# Patient Record
Sex: Female | Born: 2000
Health system: Southern US, Community
[De-identification: ages and names within clinical notes are randomized; demographics above are authoritative.]

## PROBLEM LIST (undated history)

## (undated) DIAGNOSIS — R519 Headache, unspecified: Secondary | ICD-10-CM

## (undated) DIAGNOSIS — R51 Headache: Secondary | ICD-10-CM

## (undated) DIAGNOSIS — R42 Dizziness and giddiness: Secondary | ICD-10-CM

## (undated) HISTORY — DX: Headache: R51

## (undated) HISTORY — DX: Dizziness and giddiness: R42

## (undated) HISTORY — DX: Headache, unspecified: R51.9

## (undated) HISTORY — PX: TOOTH EXTRACTION: SUR596

---
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
## 2018-03-23 ENCOUNTER — Other Ambulatory Visit: Payer: Self-pay | Admitting: "Endocrinology

## 2018-03-23 DIAGNOSIS — E221 Hyperprolactinemia: Secondary | ICD-10-CM | POA: Diagnosis not present

## 2018-03-24 LAB — PROLACTIN: Prolactin: 21.4 ng/mL (ref 4.8–23.3)

## 2018-03-27 ENCOUNTER — Ambulatory Visit (INDEPENDENT_AMBULATORY_CARE_PROVIDER_SITE_OTHER): Payer: 59 | Admitting: "Endocrinology

## 2018-03-27 ENCOUNTER — Encounter: Payer: Self-pay | Admitting: "Endocrinology

## 2018-03-27 VITALS — BP 103/71 | HR 75 | Ht 68.0 in | Wt 125.0 lb

## 2018-03-27 DIAGNOSIS — E221 Hyperprolactinemia: Secondary | ICD-10-CM | POA: Diagnosis not present

## 2018-03-27 MED ORDER — BROMOCRIPTINE MESYLATE 2.5 MG PO TABS: 1.2500 mg | ORAL_TABLET | ORAL | 3 refills | Status: DC

## 2018-03-27 MED ORDER — BROMOCRIPTINE MESYLATE 2.5 MG PO TABS: 1.2500 mg | ORAL_TABLET | ORAL | 6 refills | Status: DC

## 2018-03-27 MED FILL — BROMOCRIPTINE 2.5 MG TABLET: 2.5 | 84 days supply | Qty: 6 | Fill #0

## 2018-03-27 NOTE — Progress Notes (Signed)
Endocrinology follow-up note  Subjective:    Patient ID: Katherine Howe, female    DOB: 2000-08-24, PCP Lemmie Evens, MD   Past Medical History:  Diagnosis Date  . Dizziness   . Headache    Past Surgical History:  Procedure Laterality Date  . TOOTH EXTRACTION     5th-6th grade  . WISDOM TOOTH EXTRACTION  12/10/2015   Social History   Socioeconomic History  . Marital status: Single    Spouse name: Not on file  . Number of children: 0  . Years of education: 1  . Highest education level: Not on file  Occupational History  . 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 Financial resource strain: Not on file  . Food insecurity:    Worry: Not on file    Inability: Not on file  . Transportation needs:    Medical: Not on file    Non-medical: Not on file  Tobacco Use  . Smoking status: Never Smoker  . Smokeless tobacco: Never Used  Substance and Sexual Activity  . Alcohol use: No  . Drug use: No  . Sexual activity: Never  Lifestyle  . Physical activity:    Days per week: Not on file    Minutes per session: Not on file  . Stress: Not on file  Relationships  . Social connections:    Talks on phone: Not on file    Gets together: Not on file    Attends religious service: Not on file    Active member of club or organization: Not on file    Attends meetings of clubs or organizations: Not on file    Relationship status: Not on file  Other Topics Concern  . Not on file  Social History Narrative   Lives at home w/ her family   Right-handed   Caffeine: drinks 1 diet drink per day   Outpatient Encounter Medications as of 03/27/2018  Medication Sig  . bromocriptine (PARLODEL) 2.5 MG tablet Take 0.5 tablets (1.25 mg total) by mouth 2 (two) times a week.  Marland Kitchen ibuprofen (ADVIL,MOTRIN) 200 MG tablet Take 400 mg by mouth every 6 (six) hours as needed.  . [DISCONTINUED] bromocriptine (PARLODEL) 2.5 MG tablet Take 0.5 tablets (1.25 mg total) by mouth once a week.  .  [DISCONTINUED] bromocriptine (PARLODEL) 2.5 MG tablet Take 0.5 tablets (1.25 mg total) by mouth once a week.   No facility-administered encounter medications on file as of 03/27/2018.    ALLERGIES: Allergies  Allergen Reactions  . Zithromax [Azithromycin] Hives    VACCINATION STATUS:  There is no immunization history on file for this patient.  HPI   17 year old female patient with medical history as above. She is being seen in follow-up for hyperprolactinemia associated with pituitary hyperplasia confirmed by MRI on 3 separate occasions, since 2016-12-09.  -She is currently on bromocriptine 1.25 mg once a week for hyperprolactinemia.  Her previsit prolactin level is higher at 21.4 increasing from 10.7 during her last visit.  Her prolactin maximum of 30.2 prior to initiation of treatment with bromocriptine.    She last underwent MRI of the brain in November 2018 due to headaches, dizziness. This study was reported to show similar findings with her prior studies including 2 MRIs done in 12-09-16.   The second MRI in 2016-12-09 confirmed pituitary height of 11.3 with no discrete adenoma however possible mass effect upon overlying optic chiasm, causing mild flattening of the optic nerves bilaterally.  No micro or  macroadenoma was reported.  - She reports clinical improvement with no further visual complaints nor headaches. - She denies galactorrhea, and now her menstrual cycles are normal. - She is in college majoring in business , not planning any pregnancy in the near future.    Review of Systems  Constitutional: + She has lost 3 pounds since last visit,   no fatigue, no subjective hyperthermia, no subjective hypothermia Eyes: no blurry vision, no xerophthalmia ENT: no sore throat, no nodules palpated in throat, no dysphagia/odynophagia, no hoarseness Cardiovascular: no Chest Pain, no Shortness of Breath, no palpitations, no leg swelling Respiratory: no cough, no SOB Gastrointestinal:  no Nausea/Vomiting/Diarhhea Musculoskeletal: no muscle/joint aches Skin: no rashes Neurological: no tremors, no numbness, no tingling, + dizziness/+ headaches  Psychiatric: no depression, no anxiety  Objective:    BP 103/71   Pulse 75   Ht 5\' 8"  (1.727 m)   Wt 125 lb (56.7 kg)   BMI 19.01 kg/m   Wt Readings from Last 3 Encounters:  03/27/18 125 lb (56.7 kg) (48 %, Z= -0.06)*  10/31/17 128 lb (58.1 kg) (55 %, Z= 0.14)*  06/02/17 130 lb (59 kg) (61 %, Z= 0.27)*   * Growth percentiles are based on CDC (Girls, 2-20 Years) data.    Physical Exam  Constitutional: Appropriate weight for height, not in acute distress.  She has normal state of mind.  Eyes: PERRLA, EOMI, no exophthalmos ENT: moist mucous membranes, no thyromegaly, no cervical lymphadenopathyt Musculoskeletal: No gross deformities,  strength intact in all four extremities Skin: moist, warm, no rashes Neurological: no tremor with outstretched hands    Recent Results (from the past 2160 hour(s))  Prolactin     Status: None   Collection Time: 03/23/18  2:21 PM  Result Value Ref Range   Prolactin 21.4 4.8 - 23.3 ng/mL    Assessment & Plan:   1. Hyperprolactinemia ([place])  -The official report of the MRI is that of pituitary hyperplasia  ( with no distinct adenoma) to significant height of 11.3 mm ( normal at 9 mm) with local mass effect on the optic chiasm causing clinically significant blurry vision/headaches.  -  She has felt better since her last visit. She is tolerating  bromocriptine.   - She will continue to benefit from low-dose bromocriptine with a goal of  keeping prolactin in normal range for at least 2 years provided that her surveillance MRI does not show any nodular lesions.   -Due to increasing prolactin level to 21.4 from 10.7 during her last visit, I advised her to increase her bromocriptine to 1.25 mg twice weekly .  -Bromocriptine has been used safely in younger patients older than 19 years  old.  -If she remains clinically stable, she would not require pituitary/sella MRI for now.  - However, if she starts to have sudden visual change or worsening headache, she may need repeat MRI sooner.  - I plan to see her in 6 months with repeat of her prolactin levels.  Her other endocrine functions are  within normal limits.   -Please fax a copy of this note to Dr. Sarina Ill , neurology [facility] and Dr. Sharol Roussel, Optometry.  - I advised patient to maintain close follow up with Lemmie Evens, MD for primary care needs. Follow up plan: Return in about 6 months (around 09/27/2018) for Follow up with Pre-visit Labs.  Glade Lloyd, MD Phone: [phone]  Fax: [phone]  -  This note was partially dictated with voice recognition software. Similar  sounding words can be transcribed inadequately or may not  be corrected upon review.  03/27/2018, 4:37 PM

## 2018-03-30 ENCOUNTER — Other Ambulatory Visit: Payer: Self-pay | Admitting: "Endocrinology

## 2018-03-30 ENCOUNTER — Telehealth: Payer: Self-pay | Admitting: "Endocrinology

## 2018-03-30 MED ORDER — BROMOCRIPTINE MESYLATE 2.5 MG PO TABS: 1.2500 mg | ORAL_TABLET | ORAL | 3 refills | Status: DC

## 2018-03-30 NOTE — Telephone Encounter (Signed)
She needs 4 each month, I ordered 12 for 3 months with 3 refills.

## 2018-03-30 NOTE — Telephone Encounter (Signed)
Hope called on behalf of Kimbly stating that the pharmacy only gave her #6 pills and now she is taking  2 again and mother is stating she will need more, please advise?

## 2018-04-18 MED FILL — BROMOCRIPTINE 2.5 MG TABLET: 2.5 | 84 days supply | Qty: 12 | Fill #0

## 2018-05-13 DIAGNOSIS — J029 Acute pharyngitis, unspecified: Secondary | ICD-10-CM | POA: Diagnosis not present

## 2018-07-08 DIAGNOSIS — J069 Acute upper respiratory infection, unspecified: Secondary | ICD-10-CM | POA: Diagnosis not present

## 2018-08-05 IMAGING — MR MR HEAD WO/W CM
6 of 10 series · 19 of 48 positions shown · IV contrast (Yes)
Comparison: MRI head 11/09/2016

CLINICAL DATA: Enlarged pituitary gland. Headache and vision
change.

EXAM:
MRI HEAD WITHOUT AND WITH CONTRAST
TECHNIQUE: Multiplanar, multiecho pulse sequences of the brain and surrounding
structures were obtained without and with intravenous contrast.
CONTRAST:  6mL MULTIHANCE GADOBENATE DIMEGLUMINE 529 MG/ML IV SOLN

[Series 3: T2 · axial · 5.0mm · 0.43mm/px · z∈[-60,+91]mm · 4 of 24 slices shown]
[im 1/24]
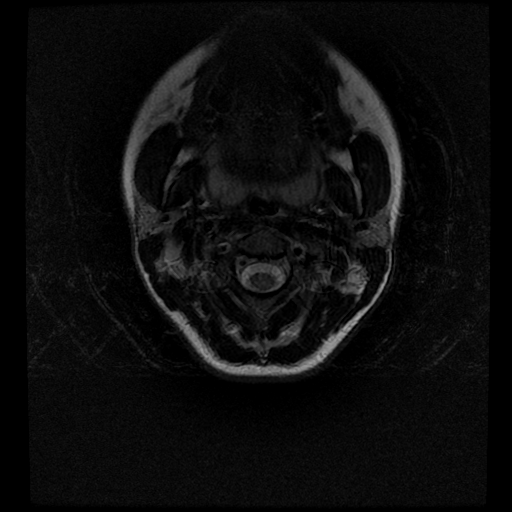
[im 8/24]
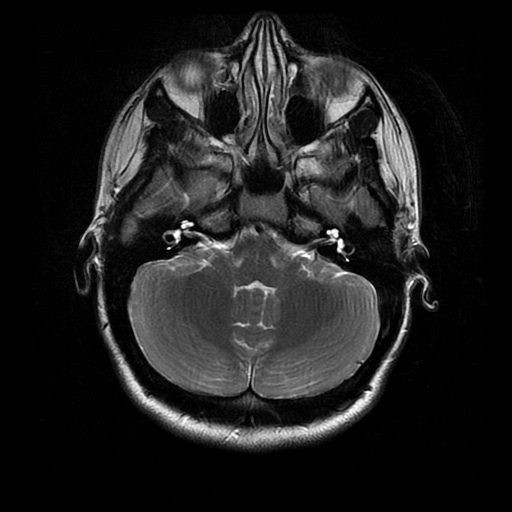
[im 16/24]
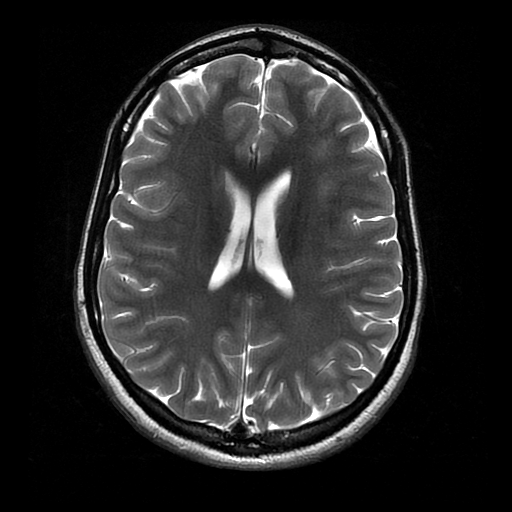
[im 24/24]
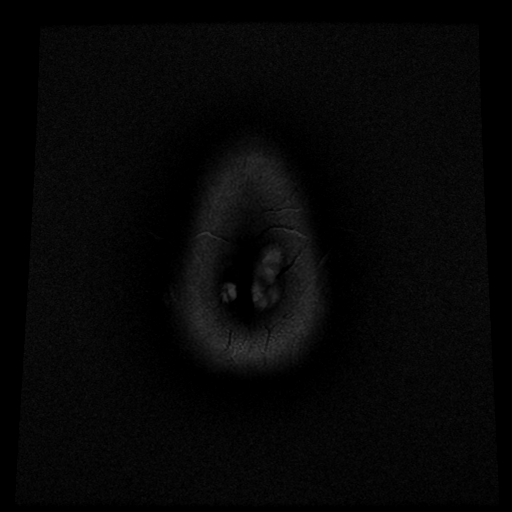

[Series 5: FLAIR · axial · 3.0mm · 0.43mm/px · z∈[-60,+77]mm · 5 of 26 slices shown]
[im 1/26]
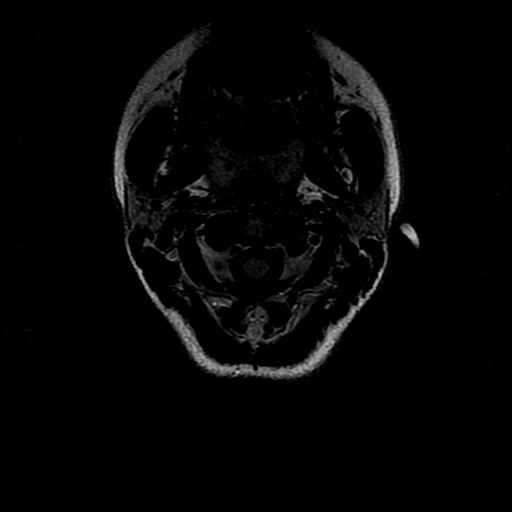
[im 7/26]
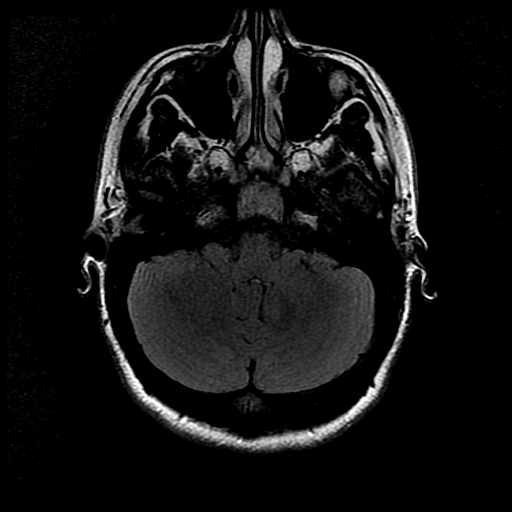
[im 13/26]
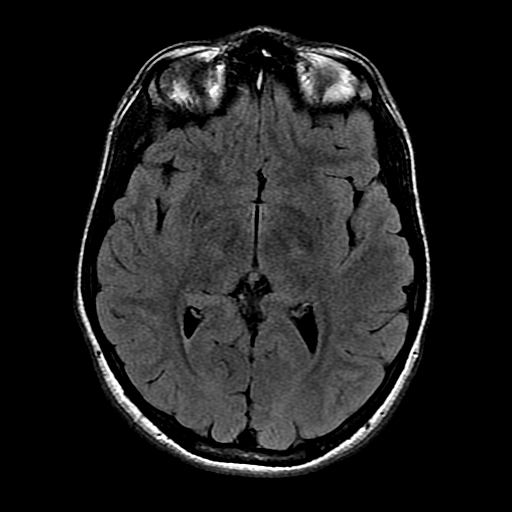
[im 19/26]
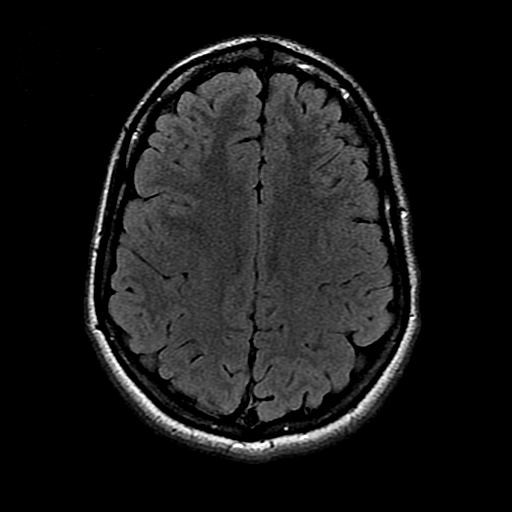
[im 26/26]
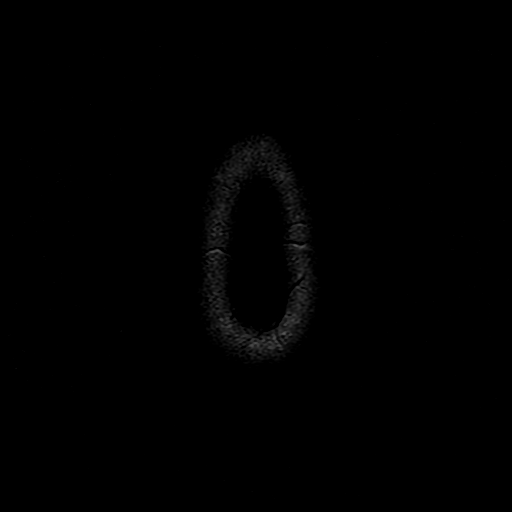

[Series 6: T1 · sagittal · 3.0mm · 0.35mm/px · 2 of 12 slices shown]
[im 1/12]
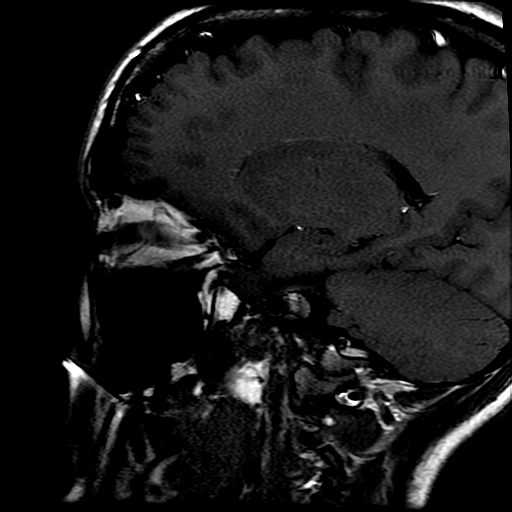
[im 12/12]
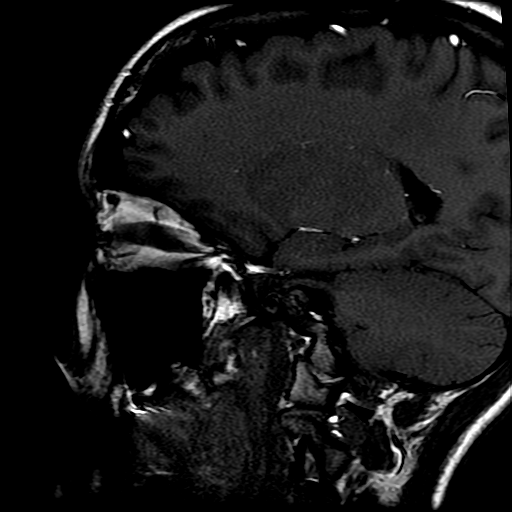

[Series 10: T1 post-contrast · sagittal · 3.0mm · 0.35mm/px · 2 of 12 slices shown (1 of 3)]
[im 1/12]
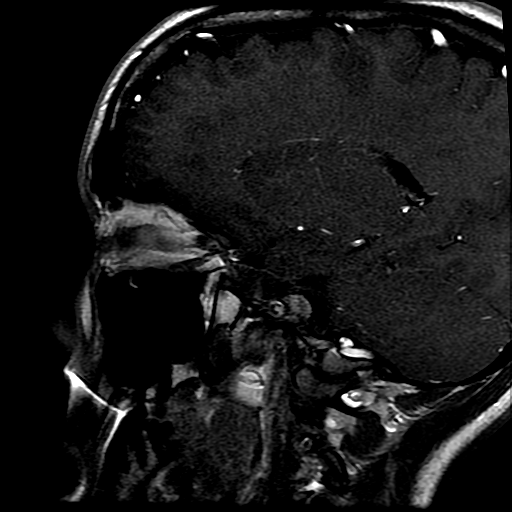
[im 12/12]
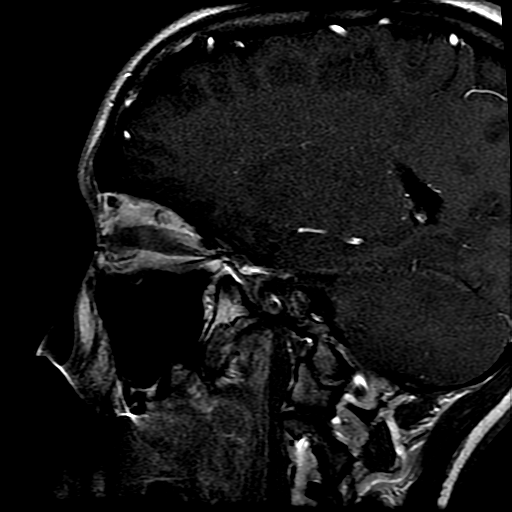

[Series 11: T1 post-contrast · coronal · 3.0mm · 0.35mm/px · 2 of 12 slices shown (2 of 3)]
[im 1/12]
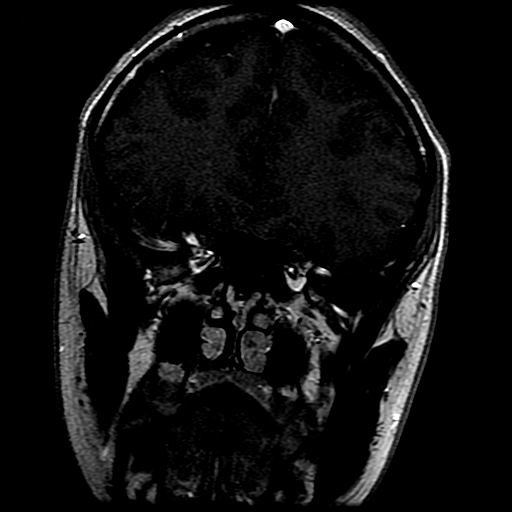
[im 12/12]
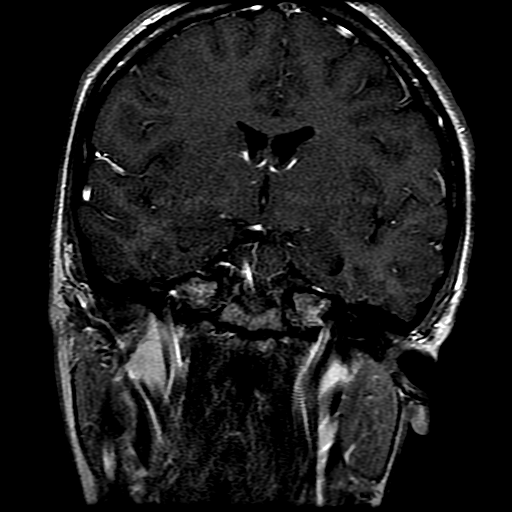

[Series 12: T1 post-contrast · coronal · 5.0mm · 0.45mm/px · 4 of 24 slices shown (3 of 3)]
[im 1/24]
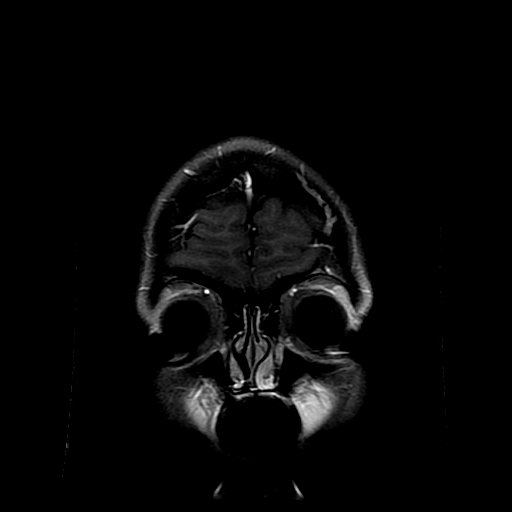
[im 8/24]
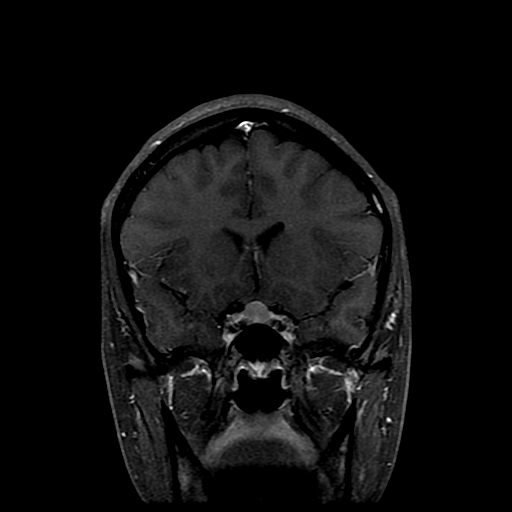
[im 16/24]
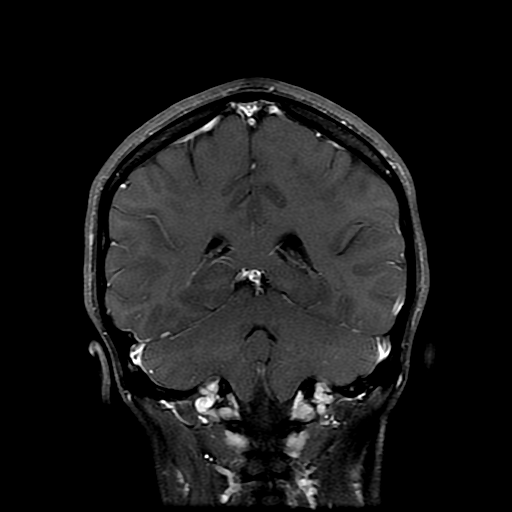
[im 24/24]
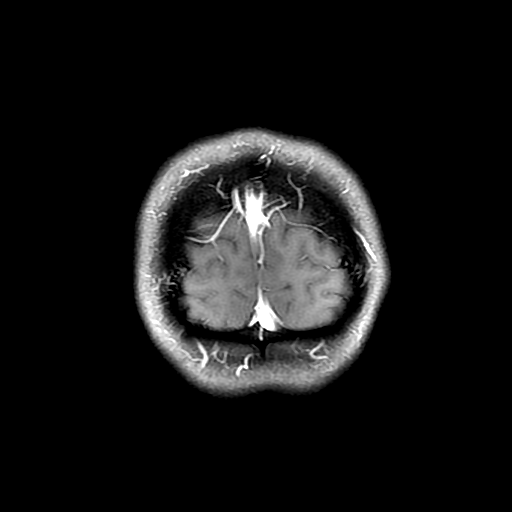

[19 of 48 positions shown; findings below may reference images not displayed]

FINDINGS: Brain: Dynamic pituitary protocol was performed. Pituitary is mildly
enlarged measuring 11.3 mm in height. Convex superior border of the
pituitary gland touching the optic chiasm causing mild flattening of
the optic nerves bilaterally. The pituitary enhances homogeneously.
No evidence of microadenoma. Cavernous sinus normal. Infundibulum is
compressed but midline.

Vascular: Normal arterial flow voids.  Normal venous enhancement.

Skull and upper cervical spine: Negative

Sinuses/Orbits: Negative

Other: None
IMPRESSION: Enlarged pituitary measuring 11.3 mm in height. The pituitary
enhances homogeneously. Findings most compatible with pituitary
hyperplasia. Negative for microadenoma. Mild flattening of the optic
nerves bilaterally

## 2018-08-09 MED FILL — BROMOCRIPTINE 2.5 MG TABLET: 2.5 | 84 days supply | Qty: 12 | Fill #1

## 2018-08-14 ENCOUNTER — Encounter: Payer: Self-pay | Admitting: Orthopedic Surgery

## 2018-08-14 ENCOUNTER — Ambulatory Visit (INDEPENDENT_AMBULATORY_CARE_PROVIDER_SITE_OTHER): Payer: 59

## 2018-08-14 ENCOUNTER — Ambulatory Visit (INDEPENDENT_AMBULATORY_CARE_PROVIDER_SITE_OTHER): Payer: 59 | Admitting: Orthopedic Surgery

## 2018-08-14 VITALS — BP 102/67 | HR 98 | Ht 68.0 in | Wt 123.0 lb

## 2018-08-14 DIAGNOSIS — M19072 Primary osteoarthritis, left ankle and foot: Secondary | ICD-10-CM

## 2018-08-14 DIAGNOSIS — M25572 Pain in left ankle and joints of left foot: Secondary | ICD-10-CM | POA: Diagnosis not present

## 2018-08-14 NOTE — Progress Notes (Addendum)
Progress Note   Patient ID: Katherine Howe, female   DOB: [birth year], 17 y.o.   MRN: [id number]   Chief Complaint  Patient presents with  . Foot Pain    Left no known injuries    HPI The patient presents for evaluation of left foot   Location left foot Duration weeks 3-4 Quality dull aching pain Severity primarily mild occasionally severe and intermittent Associated with standing walking toeing off  No trauma  Review of Systems  Neurological: Negative for tingling.  All other systems reviewed and are negative.  Current Meds  Medication Sig  . bromocriptine (PARLODEL) 2.5 MG tablet Take 0.5 tablets (1.25 mg total) by mouth 2 (two) times a week.    Past Medical History:  Diagnosis Date  . Dizziness   . Headache      Allergies  Allergen Reactions  . Zithromax [Azithromycin] Hives     BP 102/67   Pulse 98   Ht 5\' 8"  (1.727 m)   Wt 123 lb (55.8 kg)   BMI 18.70 kg/m    Physical Exam General appearance normal Oriented x3 normal Mood pleasant affect normal Gait normal  Ortho Exam Right foot  Inspection and palpation revealed no abnormalities Range of motion is full No instability was detected on stress testing Muscle tone and strength was normal without tremor Skin was warm dry and intact Good pulse and temperature were noted in the extremity Sensation revealed no abnormalities to light touch  Left foot ankle Tenderness at the anterior talofibular ligament into the sinus tarsi and into the dorsum of the foot no swelling Full range of motion at the ankle Trace positive anterior drawer with firm endpoint mild sulcus sign no inversion pain or instability no pain with anterior drawer maneuver Muscle tone and strength normal no tremor no atrophy Skin warm dry and intact no erythema Pulse and temperature of the foot normal No sensory changes   MEDICAL DECISION MAKING   Imaging:  X-ray foot and ankle left see dictated report no fracture  dislocation or bony abnormality no degenerative changes no swelling no soft tissue swelling  Encounter Diagnoses  Name Primary?  . Pain of joint of left ankle and foot Yes  . Ankle inflammation, left      PLAN: (RX., injection, surgery,frx,mri/ct, XR 2 body ares) Recommend ankle sleeve and anti-inflammatories should resolve over 4 to 6 weeks  No orders of the defined types were placed in this encounter.  9:57 PM 08/14/2018

## 2018-08-14 NOTE — Patient Instructions (Signed)
Get ankle sleeve for your ankle, at one of the local pharmacies. Take Aleve or Ibuprofen daily, for the next 2 weeks. You can also alternate heat and ice for the area.

## 2018-09-29 DIAGNOSIS — Z111 Encounter for screening for respiratory tuberculosis: Secondary | ICD-10-CM | POA: Diagnosis not present

## 2018-11-16 MED FILL — BROMOCRIPTINE 2.5 MG TABLET: 2.5 | 84 days supply | Qty: 12 | Fill #0

## 2018-12-13 ENCOUNTER — Telehealth: Payer: Self-pay

## 2018-12-13 DIAGNOSIS — E221 Hyperprolactinemia: Secondary | ICD-10-CM

## 2018-12-13 NOTE — Telephone Encounter (Signed)
LeighAnn Khadeeja Elden, CMA  

## 2018-12-14 DIAGNOSIS — E221 Hyperprolactinemia: Secondary | ICD-10-CM | POA: Diagnosis not present

## 2018-12-15 LAB — PROLACTIN: Prolactin: 28.7 ng/mL — ABNORMAL HIGH (ref 4.8–23.3)

## 2018-12-18 ENCOUNTER — Other Ambulatory Visit: Payer: Self-pay

## 2018-12-18 ENCOUNTER — Ambulatory Visit (INDEPENDENT_AMBULATORY_CARE_PROVIDER_SITE_OTHER): Payer: 59 | Admitting: "Endocrinology

## 2018-12-18 ENCOUNTER — Encounter: Payer: Self-pay | Admitting: "Endocrinology

## 2018-12-18 DIAGNOSIS — E221 Hyperprolactinemia: Secondary | ICD-10-CM | POA: Diagnosis not present

## 2018-12-18 NOTE — Progress Notes (Signed)
Endocrinology Telehealth Visit Follow up Note -During COVID -19 Pandemic  I connected with@ on 12/18/2018   by telephone and verified that I am speaking with the correct person using two identifiers. Katherine Howe, July 18, 2001. she has verbally consented to this visit. All issues noted in this document were discussed and addressed. The format was not optimal for physical exam.   Subjective:    Patient ID: Katherine Howe, female    DOB: 08-07-2001, PCP Lemmie Evens, MD   Past Medical History:  Diagnosis Date  . Dizziness   . Headache    Past Surgical History:  Procedure Laterality Date  . TOOTH EXTRACTION     5th-6th grade  . WISDOM TOOTH EXTRACTION  2017   Social History   Socioeconomic History  . Marital status: Single    Spouse name: Not on file  . Number of children: 0  . Years of education: 46  . Highest education level: Not on file  Occupational History  . 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 Financial resource strain: Not on file  . Food insecurity:    Worry: Not on file    Inability: Not on file  . Transportation needs:    Medical: Not on file    Non-medical: Not on file  Tobacco Use  . Smoking status: Never Smoker  . Smokeless tobacco: Never Used  Substance and Sexual Activity  . Alcohol use: No  . Drug use: No  . Sexual activity: Never  Lifestyle  . Physical activity:    Days per week: Not on file    Minutes per session: Not on file  . Stress: Not on file  Relationships  . Social connections:    Talks on phone: Not on file    Gets together: Not on file    Attends religious service: Not on file    Active member of club or organization: Not on file    Attends meetings of clubs or organizations: Not on file    Relationship status: Not on file  Other Topics Concern  . Not on file  Social History Narrative   Lives at home w/ her family   Right-handed   Caffeine: drinks 1 diet drink per day    Outpatient Encounter Medications as of 12/18/2018  Medication Sig  . bromocriptine (PARLODEL) 2.5 MG tablet Take 1.25 mg by mouth as directed. 3 times a week  . ibuprofen (ADVIL,MOTRIN) 200 MG tablet Take 400 mg by mouth every 6 (six) hours as needed.  . [DISCONTINUED] bromocriptine (PARLODEL) 2.5 MG tablet Take 0.5 tablets (1.25 mg total) by mouth 2 (two) times a week.   No facility-administered encounter medications on file as of 12/18/2018.    ALLERGIES: Allergies  Allergen Reactions  . Zithromax [Azithromycin] Hives    VACCINATION STATUS:  There is no immunization history on file for this patient.  HPI   18 year old female patient with medical history as above. She is being engaged in telehealth in follow-up for hyperprolactinemia associated with pituitary hyperplasia confirmed by MRI on 3 separate occasions, since March 2018.  -She is currently on bromocriptine 1.25 mg twice a week for hyperprolactinemia.  Her previsit prolactin level is higher at 28.7 increasing from 10.7 during her last visit.  Her prolactin maximum of 30.2 prior to initiation of treatment with  bromocriptine.    She last underwent MRI of the brain in November 2018 due to headaches, dizziness. This study was reported to show similar findings with her prior studies including 2 MRIs done in Nov 24, 2016.   The second MRI in 11/24/2016 confirmed pituitary height of 11.3 with no discrete adenoma however possible mass effect upon overlying optic chiasm, causing mild flattening of the optic nerves bilaterally.  No micro or macroadenoma was reported.  - She reports clinical improvement with no further visual complaints nor headaches. - She denies galactorrhea, and now her menstrual cycles are normal. - She is in college majoring in business , not planning any pregnancy in the near future.    Review of Systems Limited: As above.   Objective:    There were no vitals taken for this visit.  Wt Readings from Last 3  Encounters:  08/14/18 123 lb (55.8 kg) (42 %, Z= -0.21)*  03/27/18 125 lb (56.7 kg) (48 %, Z= -0.06)*  10/31/17 128 lb (58.1 kg) (55 %, Z= 0.14)*   * Growth percentiles are based on CDC (Girls, 2-20 Years) data.    Physical Exam   Recent Results (from the past 11/25/58 hour(s))  Prolactin     Status: Abnormal   Collection Time: 12/14/18  9:44 AM  Result Value Ref Range   Prolactin 28.7 (H) 4.8 - 23.3 ng/mL    Assessment & Plan:   1. Hyperprolactinemia ([place])  -The official report of the MRI is that of pituitary hyperplasia  ( with no distinct adenoma) to significant height of 11.3 mm ( normal at 9 mm) with local mass effect on the optic chiasm causing clinically significant blurry vision/headaches.  -  She has felt better since her last visit. She is tolerating  bromocriptine.   - She will continue to benefit from low-dose bromocriptine with a goal of  keeping prolactin in normal range for at least 2 years provided that her surveillance MRI does not show any nodular lesions.   -Due to increasing prolactin level to 28.7 from 10.7 during her last visit, I advised her to increase her bromocriptine to 1.25 mg 3 times weekly .  -Bromocriptine has been used safely in younger patients older than 20 years old.  Her clinical response is not satisfactory, she will be considered for cabergoline therapy after her next visit.  -If she remains clinically stable, she would not require pituitary/sella MRI for now.  - However, if she starts to have sudden visual change or worsening headache, she may need repeat MRI sooner.  - I plan to see her in 6 months with repeat of her prolactin levels.  Her other endocrine functions are  within normal limits.   -Please fax a copy of this note to Dr. Sarina Ill , neurology [facility] and Dr. Sharol Roussel, Optometry.  - I advised patient to maintain close follow up with Lemmie Evens, MD for primary care needs. Follow up plan: Return in about 3 months  (around 03/19/2019) for Follow up with Pre-visit Labs.  Glade Lloyd, MD Phone: [phone]  Fax: [phone]  -  This note was partially dictated with voice recognition software. Similar sounding words can be transcribed inadequately or may not  be corrected upon review.  12/18/2018, 5:27 PM

## 2019-01-30 DIAGNOSIS — B081 Molluscum contagiosum: Secondary | ICD-10-CM | POA: Diagnosis not present

## 2019-01-30 DIAGNOSIS — L739 Follicular disorder, unspecified: Secondary | ICD-10-CM | POA: Diagnosis not present

## 2019-02-19 ENCOUNTER — Other Ambulatory Visit: Payer: Self-pay | Admitting: "Endocrinology

## 2019-02-19 ENCOUNTER — Telehealth: Payer: Self-pay

## 2019-02-19 MED ORDER — CABERGOLINE 0.5 MG PO TABS: 0.2500 mg | ORAL_TABLET | ORAL | 1 refills | Status: DC

## 2019-02-19 MED FILL — CABERGOLINE 0.5 MG TABS: 0.5 | 28 days supply | Qty: 8 | Fill #0

## 2019-02-19 NOTE — Telephone Encounter (Signed)
Patients mother is aware 

## 2019-02-19 NOTE — Telephone Encounter (Signed)
[place] mom Is calling stating that Shirlee Limerick has finished her medication that was prescribed and that you were going to call her in a New Medication once the other had been taken, Please call in new Medication to [facility], Thanks

## 2019-02-19 NOTE — Telephone Encounter (Signed)
Can you verify patient name. Is it [facility] or Daissy Surret?

## 2019-02-19 NOTE — Telephone Encounter (Signed)
Mothers name is Hope patients name is Katherine Howe

## 2019-02-19 NOTE — Telephone Encounter (Signed)
I sent a prescription to [facility] from

## 2019-03-21 ENCOUNTER — Ambulatory Visit: Payer: 59 | Admitting: "Endocrinology

## 2019-03-30 ENCOUNTER — Other Ambulatory Visit: Payer: Self-pay | Admitting: "Endocrinology

## 2019-03-30 DIAGNOSIS — E221 Hyperprolactinemia: Secondary | ICD-10-CM | POA: Diagnosis not present

## 2019-03-31 LAB — PROLACTIN: Prolactin: 4.7 ng/mL — ABNORMAL LOW (ref 4.8–23.3)

## 2019-04-09 ENCOUNTER — Encounter: Payer: Self-pay | Admitting: "Endocrinology

## 2019-04-09 ENCOUNTER — Other Ambulatory Visit: Payer: Self-pay

## 2019-04-09 ENCOUNTER — Ambulatory Visit (INDEPENDENT_AMBULATORY_CARE_PROVIDER_SITE_OTHER): Payer: 59 | Admitting: "Endocrinology

## 2019-04-09 DIAGNOSIS — E221 Hyperprolactinemia: Secondary | ICD-10-CM | POA: Diagnosis not present

## 2019-04-09 MED ORDER — CABERGOLINE 0.5 MG PO TABS: 0.2500 mg | ORAL_TABLET | ORAL | 2 refills | Status: DC

## 2019-04-09 NOTE — Progress Notes (Signed)
04/09/2019                                 Endocrinology Telehealth Visit Follow up Note -During COVID -19 Pandemic  I connected with Katherine Howe on 04/09/2019   by telephone and verified that I am speaking with the correct person using two identifiers. Katherine Scot Herald, Apr 23, 2001. she has verbally consented to this visit. All issues noted in this document were discussed and addressed. The format was not optimal for physical exam.   Subjective:    Patient ID: Katherine Howe, female    DOB: 2001/02/24, PCP Lemmie Evens, MD   Past Medical History:  Diagnosis Date  . Dizziness   . Headache    Past Surgical History:  Procedure Laterality Date  . TOOTH EXTRACTION     5th-6th grade  . WISDOM TOOTH EXTRACTION  2017   Social History   Socioeconomic History  . Marital status: Single    Spouse name: Not on file  . Number of children: 0  . Years of education: 78  . Highest education level: Not on file  Occupational History  . 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 Financial resource strain: Not on file  . Food insecurity    Worry: Not on file    Inability: Not on file  . Transportation needs    Medical: Not on file    Non-medical: Not on file  Tobacco Use  . Smoking status: Never Smoker  . Smokeless tobacco: Never Used  Substance and Sexual Activity  . Alcohol use: No  . Drug use: No  . Sexual activity: Never  Lifestyle  . Physical activity    Days per week: Not on file    Minutes per session: Not on file  . Stress: Not on file  Relationships  . Social Herbalist on phone: Not on file    Gets together: Not on file    Attends religious service: Not on file    Active member of club or organization: Not on file    Attends meetings of clubs or organizations: Not on file    Relationship status: Not on file  Other Topics Concern  . Not on file  Social History Narrative   Lives at home w/ her family   Right-handed   Caffeine: drinks  1 diet drink per day   Outpatient Encounter Medications as of 04/09/2019  Medication Sig  . cabergoline (DOSTINEX) 0.5 MG tablet Take 0.5 tablets (0.25 mg total) by mouth 2 (two) times a week.  Marland Kitchen ibuprofen (ADVIL,MOTRIN) 200 MG tablet Take 400 mg by mouth every 6 (six) hours as needed.  . [DISCONTINUED] cabergoline (DOSTINEX) 0.5 MG tablet Take 0.5 tablets (0.25 mg total) by mouth 2 (two) times a week.   No facility-administered encounter medications on file as of 04/09/2019.    ALLERGIES: Allergies  Allergen Reactions  . Zithromax [Azithromycin] Hives    VACCINATION STATUS:  There is no immunization history on file for this patient.  HPI   18 year old female patient with medical history as above. She is being engaged in telehealth in follow-up for hyperprolactinemia associated with pituitary hyperplasia confirmed by MRI on 3 separate occasions, since March 2018.  -She is currently on cabergoline 0.25 mg p.o. twice a week for hyperprolactinemia.      Her previsit prolactin level is controlled at 4.7, improving from 28.7 during her last visit.  Her prolactin maximum of 30.2 prior to initiation of treatment with bromocriptine.    She last underwent MRI of the brain in November 2018 due to headaches, dizziness. This study was reported to show similar findings with her prior studies including 2 MRIs done in December 10, 2016.   The second MRI in 2016/12/10 confirmed pituitary height of 11.3 with no discrete adenoma however possible mass effect upon overlying optic chiasm, causing mild flattening of the optic nerves bilaterally.  No micro or macroadenoma was reported.  - She reports clinical improvement with no further visual complaints nor headaches. - She denies galactorrhea, and now her menstrual cycles are normal. - She is in college majoring in business , not planning any pregnancy in the near future.    Review of Systems Limited: As above.   Objective:    There were no vitals taken  for this visit.  Wt Readings from Last 3 Encounters:  08/14/18 123 lb (55.8 kg) (42 %, Z= -0.21)*  03/27/18 125 lb (56.7 kg) (48 %, Z= -0.06)*  10/31/17 128 lb (58.1 kg) (55 %, Z= 0.14)*   * Growth percentiles are based on CDC (Girls, 2-20 Years) data.    Physical Exam   Recent Results (from the past 2158-12-11 hour(s))  Prolactin     Status: Abnormal   Collection Time: 03/30/19  8:01 AM  Result Value Ref Range   Prolactin 4.7 (L) 4.8 - 23.3 ng/mL    Assessment & Plan:   1. Hyperprolactinemia ([place])  -The official report of the MRI is that of pituitary hyperplasia  ( with no distinct adenoma) to significant height of 11.3 mm ( normal at 9 mm) with local mass effect on the optic chiasm causing clinically significant blurry vision/headaches.  -  She has felt better since her last visit. She switched to cabergoline since her last visit currently at 0.25 mg p.o. twice a week.  She has responded with lowering of her prolactin to 4.7.   - She will continue to benefit from low-dose cabergoline with a goal of  keeping prolactin in normal range for at least 2 years provided that her surveillance MRI does not show any nodular lesions.     -If she remains clinically stable, she would not require pituitary/sella MRI for now.  - However, if she starts to have sudden visual change or worsening headache, she may need repeat MRI sooner.  - I plan to see her in 6 months with repeat of her prolactin levels.  Her other endocrine functions are  within normal limits.   -Please fax a copy of this note to Dr. Sarina Ill , neurology [facility] and Dr. Sharol Roussel, Optometry.  - I advised patient to maintain close follow up with Lemmie Evens, MD for primary care needs.   Time for this visit: 15 minutes. Kendyl Festa Leedom  participated in the discussions, expressed understanding, and voiced agreement with the above plans.  All questions were answered to her satisfaction. she is encouraged to contact  clinic should she have any questions or concerns prior to her return visit.  Follow up plan: Return in about 6 months (around 10/10/2019) for Follow up with Pre-visit Labs.  Glade Lloyd, MD Phone: [phone]  Fax: [phone]  -  This note was partially dictated with voice recognition software. Similar sounding words can be transcribed inadequately or may not  be corrected upon review.  04/09/2019, 5:57 PM

## 2019-04-10 ENCOUNTER — Ambulatory Visit: Payer: 59 | Admitting: "Endocrinology

## 2019-04-10 ENCOUNTER — Other Ambulatory Visit: Payer: Self-pay

## 2019-04-10 MED ORDER — CABERGOLINE 0.5 MG PO TABS: 0.2500 mg | ORAL_TABLET | ORAL | 2 refills | Status: DC

## 2019-04-10 MED FILL — CABERGOLINE 0.5 MG TABS: 0.5 | 56 days supply | Qty: 8 | Fill #0

## 2019-05-09 DIAGNOSIS — H6122 Impacted cerumen, left ear: Secondary | ICD-10-CM | POA: Diagnosis not present

## 2019-05-09 DIAGNOSIS — R0982 Postnasal drip: Secondary | ICD-10-CM | POA: Diagnosis not present

## 2019-06-25 ENCOUNTER — Telehealth (INDEPENDENT_AMBULATORY_CARE_PROVIDER_SITE_OTHER): Payer: 59 | Admitting: Women's Health

## 2019-06-25 ENCOUNTER — Encounter: Payer: Self-pay | Admitting: Women's Health

## 2019-06-25 ENCOUNTER — Other Ambulatory Visit: Payer: Self-pay

## 2019-06-25 VITALS — Ht 68.0 in

## 2019-06-25 DIAGNOSIS — N3001 Acute cystitis with hematuria: Secondary | ICD-10-CM

## 2019-06-25 MED ORDER — NITROFURANTOIN MONOHYD MACRO 100 MG PO CAPS: 100.0000 mg | ORAL_CAPSULE | Freq: Two times a day (BID) | ORAL | 0 refills | Status: DC

## 2019-06-25 NOTE — Progress Notes (Signed)
   [facility] VIRTUAL GYN VISIT ENCOUNTER NOTE Patient name: Katherine Howe MRN [id number]  Date of birth: November 20, 2000  I connected with patient on 06/25/19 at  3:10 PM EST by MyChart video  and verified that I am speaking with the correct person using two identifiers.  Due to COVID-19 recommendations, pt is not currently in the office.    I discussed the limitations, risks, security and privacy concerns of performing an evaluation and management service by telephone and the availability of in person appointments. I also discussed with the patient that there may be a patient responsible charge related to this service. The patient expressed understanding and agreed to proceed.   Chief Complaint:   burning with urination (light pink when wiping)  History of Present Illness:   Katherine Howe is a 18 y.o. G62 Caucasian female being evaluated today for uti sx that began this am. Urinary frequency, burning w/ urination, light pink w/ wiping x 1. Away at college so can't give urine specimen/culture.     Patient's last menstrual period was 06/14/2019.  Last pap <21yo. Results were:  n/a Review of Systems:   Pertinent items are noted in HPI Denies fever/chills, dizziness, headaches, visual disturbances, fatigue, shortness of breath, chest pain, abdominal pain, vomiting, abnormal vaginal discharge/itching/odor/irritation, problems with periods, bowel movements, urination, or intercourse unless otherwise stated above.  Pertinent History Reviewed:  Reviewed past medical,surgical, social, obstetrical and family history.  Reviewed problem list, medications and allergies. Physical Assessment:   Vitals:   06/25/19 1523  Height: 5\' 8"  (1.727 m)  Body mass index is 18.7 kg/m.       Physical Examination:   General:  Alert, oriented and cooperative.   Mental Status: Normal mood and affect perceived. Normal judgment and thought content.  Physical exam deferred due to nature of the encounter   No results found for this or any previous visit (from the past 24 hour(s)).  Assessment & Plan:  1) Presumed UTI> rx macrobid, let us know if not improved  Meds:  Meds ordered this encounter  Medications  . nitrofurantoin, macrocrystal-monohydrate, (MACROBID) 100 MG capsule    Sig: Take 1 capsule (100 mg total) by mouth 2 (two) times daily. X 7 days    Dispense:  14 capsule    Refill:  0    Order Specific Question:   Supervising Provider    Answer:   Elonda Husky, LUTHER H [[id number]]    No orders of the defined types were placed in this encounter.   I discussed the assessment and treatment plan with the patient. The patient was provided an opportunity to ask questions and all were answered. The patient agreed with the plan and demonstrated an understanding of the instructions.   The patient was advised to call back or seek an in-person evaluation/go to the ED if the symptoms worsen or if the condition fails to improve as anticipated.  I provided 10 minutes of non-face-to-face time during this encounter.   Return for after 8/25 for , Pap & physical.  Roma Schanz CNM, [facility] 06/25/2019 3:40 PM

## 2019-07-26 MED FILL — CABERGOLINE 0.5 MG TABS: 0.5 | 56 days supply | Qty: 8 | Fill #1

## 2019-08-17 ENCOUNTER — Telehealth: Payer: 59 | Admitting: Nurse Practitioner

## 2019-08-17 DIAGNOSIS — L239 Allergic contact dermatitis, unspecified cause: Secondary | ICD-10-CM

## 2019-08-17 MED ORDER — PREDNISONE 10 MG (21) PO TBPK: ORAL_TABLET | ORAL | 0 refills | Status: DC

## 2019-08-17 NOTE — Progress Notes (Signed)
E Visit for Rash  We are sorry that you are not feeling well. Here is how we plan to help!  Because the rash is so widespread and itches, I feel as if you ar having an allergic reation to something you have ate or drank.it will be up to you to figure out what you have ingested. I recommend you take Benadryl 25 mg - 50 mg every 4 hours to control the symptoms but if they last over 24 hours it is best that you see an office based provider for follow up.   Prednisone 10 mg daily for 6 days (see taper instructions below)  Directions for 6 day taper: Day 1: 2 tablets before breakfast, 1 after both lunch & dinner and 2 at bedtime Day 2: 1 tab before breakfast, 1 after both lunch & dinner and 2 at bedtime Day 3: 1 tab at each meal & 1 at bedtime Day 4: 1 tab at breakfast, 1 at lunch, 1 at bedtime Day 5: 1 tab at breakfast & 1 tab at bedtime Day 6: 1 tab at breakfast      HOME CARE:   Take cool showers and avoid direct sunlight.  Apply cool compress or wet dressings.  Take a bath in an oatmeal bath.  Sprinkle content of one Aveeno packet under running faucet with comfortably warm water.  Bathe for 15-20 minutes, 1-2 times daily.  Pat dry with a towel. Do not rub the rash.  Use hydrocortisone cream.  Take an antihistamine like Benadryl for widespread rashes that itch.  The adult dose of Benadryl is 25-50 mg by mouth 4 times daily.  Caution:  This type of medication may cause sleepiness.  Do not drink alcohol, drive, or operate dangerous machinery while taking antihistamines.  Do not take these medications if you have prostate enlargement.  Read package instructions thoroughly on all medications that you take.  GET HELP RIGHT AWAY IF:   Symptoms don't go away after treatment.  Severe itching that persists.  If you rash spreads or swells.  If you rash begins to smell.  If it blisters and opens or develops a yellow-brown crust.  You develop a fever.  You have a sore  throat.  You become short of breath.  MAKE SURE YOU:  Understand these instructions. Will watch your condition. Will get help right away if you are not doing well or get worse.  Thank you for choosing an e-visit. Your e-visit answers were reviewed by a board certified advanced clinical practitioner to complete your personal care plan. Depending upon the condition, your plan could have included both over the counter or prescription medications. Please review your pharmacy choice. Be sure that the pharmacy you have chosen is open so that you can pick up your prescription now.  If there is a problem you may message your provider in [place] to have the prescription routed to another pharmacy. Your safety is important to [place]. If you have drug allergies check your prescription carefully.  For the next 24 hours, you can use MyChart to ask questions about today's visit, request a non-urgent call back, or ask for a work or school excuse from your e-visit provider. You will get an email in the next two days asking about your experience. I hope that your e-visit has been valuable and will speed your recovery.     5-10 minutes spent reviewing and documenting in chart.

## 2019-10-10 ENCOUNTER — Telehealth: Payer: Self-pay | Admitting: "Endocrinology

## 2019-10-10 ENCOUNTER — Ambulatory Visit: Payer: 59 | Admitting: "Endocrinology

## 2019-10-10 NOTE — Telephone Encounter (Signed)
Faxed lab order to [phone] for labs.

## 2019-10-17 ENCOUNTER — Other Ambulatory Visit: Payer: Self-pay | Admitting: "Endocrinology

## 2019-10-17 DIAGNOSIS — E221 Hyperprolactinemia: Secondary | ICD-10-CM | POA: Diagnosis not present

## 2019-10-18 LAB — PROLACTIN: Prolactin: 1.2 ng/mL — ABNORMAL LOW (ref 4.8–23.3)

## 2019-10-18 LAB — VITAMIN D 25 HYDROXY (VIT D DEFICIENCY, FRACTURES): Vit D, 25-Hydroxy: 25.1 ng/mL — ABNORMAL LOW (ref 30.0–100.0)

## 2019-10-19 ENCOUNTER — Encounter: Payer: 59 | Admitting: "Endocrinology

## 2019-10-19 MED ORDER — CABERGOLINE 0.5 MG PO TABS: 0.2500 mg | ORAL_TABLET | ORAL | 1 refills | Status: DC

## 2019-10-19 MED FILL — CABERGOLINE 0.5 MG TABS: 0.5 | 84 days supply | Qty: 12 | Fill #0

## 2019-10-22 NOTE — Progress Notes (Signed)
This encounter was created in error - please disregard.

## 2019-10-29 ENCOUNTER — Encounter: Payer: Self-pay | Admitting: "Endocrinology

## 2019-10-29 ENCOUNTER — Ambulatory Visit (INDEPENDENT_AMBULATORY_CARE_PROVIDER_SITE_OTHER): Payer: 59 | Admitting: "Endocrinology

## 2019-10-29 ENCOUNTER — Other Ambulatory Visit: Payer: Self-pay

## 2019-10-29 ENCOUNTER — Ambulatory Visit: Payer: 59 | Admitting: "Endocrinology

## 2019-10-29 VITALS — BP 107/70 | HR 94 | Ht 68.0 in | Wt 133.0 lb

## 2019-10-29 DIAGNOSIS — E221 Hyperprolactinemia: Secondary | ICD-10-CM | POA: Diagnosis not present

## 2019-10-29 MED ORDER — VITAMIN D (ERGOCALCIFEROL) 1.25 MG (50000 UNIT) PO CAPS: 50000.0000 [IU] | ORAL_CAPSULE | ORAL | 0 refills | Status: DC

## 2019-10-29 MED FILL — VIT D2 1.25 MG (50,000 UNIT: 1.25 MG | 84 days supply | Qty: 12 | Fill #0

## 2019-10-29 NOTE — Progress Notes (Signed)
10/29/2019          Endocrinology follow-up note  Subjective:    Patient ID: Katherine Howe, female    DOB: 02/07/2001, PCP Lemmie Evens, MD   Past Medical History:  Diagnosis Date  . Dizziness   . Headache    Past Surgical History:  Procedure Laterality Date  . TOOTH EXTRACTION     5th-6th grade  . WISDOM TOOTH EXTRACTION  2017   Social History   Socioeconomic History  . Marital status: Single    Spouse name: Not on file  . Number of children: 0  . Years of education: 40  . Highest education level: Not on file  Occupational History  . 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 Smoking status: Never Smoker  . Smokeless tobacco: Never Used  Substance and Sexual Activity  . Alcohol use: No  . Drug use: No  . Sexual activity: Never    Birth control/protection: None  Other Topics Concern  . Not on file  Social History Narrative   Lives at home w/ her family   Right-handed   Caffeine: drinks 1 diet drink per day   Social Determinants of Health   Financial Resource Strain:   . Difficulty of Paying Living Expenses: Not on file  Food Insecurity:   . Worried About Charity fundraiser in the Last Year: Not on file  . Ran Out of Food in the Last Year: Not on file  Transportation Needs:   . Lack of Transportation (Medical): Not on file  . Lack of Transportation (Non-Medical): Not on file  Physical Activity:   . Days of Exercise per Week: Not on file  . Minutes of Exercise per Session: Not on file  Stress:   . Feeling of Stress : Not on file  Social Connections:   . Frequency of Communication with Friends and Family: Not on file  . Frequency of Social Gatherings with Friends and Family: Not on file  . Attends Religious Services: Not on file  . Active Member of Clubs or Organizations: Not on file  . Attends Archivist Meetings: Not on file  . Marital Status: Not on file   Outpatient Encounter Medications as of 10/29/2019  Medication Sig  .  cabergoline (DOSTINEX) 0.5 MG tablet Take 0.5 tablets (0.25 mg total) by mouth 2 (two) times a week.  Marland Kitchen ibuprofen (ADVIL,MOTRIN) 200 MG tablet Take 400 mg by mouth every 6 (six) hours as needed.  . nitrofurantoin, macrocrystal-monohydrate, (MACROBID) 100 MG capsule Take 1 capsule (100 mg total) by mouth 2 (two) times daily. X 7 days  . predniSONE (STERAPRED UNI-PAK 21 TAB) 10 MG (21) TBPK tablet As directed x 6 days  . Vitamin D, Ergocalciferol, (DRISDOL) 1.25 MG (50000 UNIT) CAPS capsule Take 1 capsule (50,000 Units total) by mouth every 7 (seven) days.   No facility-administered encounter medications on file as of 10/29/2019.   ALLERGIES: Allergies  Allergen Reactions  . Zithromax [Azithromycin] Hives    VACCINATION STATUS:  There is no immunization history on file for this patient.  HPI   19 year old female patient with medical history as above. She is being seen in f/u for  hyperprolactinemia associated with pituitary hyperplasia confirmed by MRI on 3 separate occasions, since March 2018.  -She is currently on cabergoline 0.25 mg p.o. twice a week for hyperprolactinemia.     Her previsit prolactin level is controlled at  1.2,  improving from 28.7 during 2 visits ago in April  November 24, 2018.   Her prolactin maximum of 30.2 prior to initiation of treatment with bromocriptine.    She last underwent MRI of the brain in November 2018 due to headaches, dizziness. This study was reported to show similar findings with her prior studies including 2 MRIs done in 2016/11/23.   The second MRI in 11-23-2016 confirmed pituitary height of 11.3 with no discrete adenoma however possible mass effect upon overlying optic chiasm, causing mild flattening of the optic nerves bilaterally.  No micro or macroadenoma was reported.  - She reports clinical improvement with no further visual complaints nor headaches. - She denies galactorrhea, and her menstrual cycles are normal. - She is in college majoring in business ,  not planning any pregnancy in the near future.    Review of Systems Limited: As above.   Objective:    BP 107/70   Pulse 94   Ht 5\' 8"  (1.727 m)   Wt 133 lb (60.3 kg)   BMI 20.22 kg/m   Wt Readings from Last 3 Encounters:  10/29/19 133 lb (60.3 kg)  08/14/18 123 lb (55.8 kg) (42 %, Z= -0.21)*  03/27/18 125 lb (56.7 kg) (48 %, Z= -0.06)*   * Growth percentiles are based on CDC (Girls, 2-20 Years) data.    Physical Exam   Recent Results (from the past 2158/11/24 hour(s))  Prolactin     Status: Abnormal   Collection Time: 10/17/19  2:22 PM  Result Value Ref Range   Prolactin 1.2 (L) 4.8 - 23.3 ng/mL  VITAMIN D 25 Hydroxy (Vit-D Deficiency, Fractures)     Status: Abnormal   Collection Time: 10/17/19  2:22 PM  Result Value Ref Range   Vit D, 25-Hydroxy 25.1 (L) 30.0 - 100.0 ng/mL    Comment: Vitamin D deficiency has been defined by the [place] practice guideline as a level of serum 25-OH vitamin D less than 20 ng/mL (1,2). The Endocrine Society went on to further define vitamin D insufficiency as a level between 21 and 29 ng/mL (2). 1. IOM (Institute of Medicine). November 23, 2008. Dietary reference    intakes for calcium and D. [place]: The    [employer]. 2. Holick MF, Binkley Valinda, Bischoff-Ferrari HA, et al.    Evaluation, treatment, and prevention of vitamin D    deficiency: an Endocrine Society clinical practice    guideline. JCEM. 2011 Jul; 96(7):1911-30.     Assessment & Plan:   1. Hyperprolactinemia ([place]) 2. Vitamin D Deficiency  -The official report of the MRI is that of pituitary hyperplasia  ( with no distinct adenoma) to significant height of 11.3 mm ( normal at 9 mm) with local mass effect on the optic chiasm causing clinically significant blurry vision/headaches.  -  She has felt better since her last visit. She switched to cabergoline since her last visit currently at 0.25 mg p.o. twice a week.  She has responded  with lowering of her prolactin to 1.2.   - She will continue to benefit from low-dose cabergoline with a goal of  keeping prolactin in normal range for at least 2 years provided that her surveillance MRI does not show any nodular lesions.     -If she remains clinically stable, she would not require pituitary/sella MRI for now.  - However, if she starts to have sudden visual change or worsening headache, she may need repeat MRI sooner.   For vitamin D deficiency, I discussed and initiated ergocalciferol 50,000 units  for 12 weeks.  This is a one-time supplement, with plan to repeat vitamin D measurement in 6 months.  -She will return in 6 months with repeat of her prolactin levels.  Her other endocrine functions are  within normal limits.   -Please fax a copy of this note to Dr. Sarina Ill , neurology [facility] and Dr. Sharol Roussel, Optometry.  - I advised patient to maintain close follow up with Lemmie Evens, MD for primary care needs.     - Time spent on this patient care encounter:  20 minutes of which 50% was spent in  counseling and the rest reviewing  her current and  previous labs / studies and medications  doses and developing a plan for long term care. Deshanda Ameri Barley  participated in the discussions, expressed understanding, and voiced agreement with the above plans.  All questions were answered to her satisfaction. she is encouraged to contact clinic should she have any questions or concerns prior to her return visit.   Follow up plan: Return in about 6 months (around 04/30/2020) for Follow up with Pre-visit Labs.  Glade Lloyd, MD Phone: [phone]  Fax: [phone]  -  This note was partially dictated with voice recognition software. Similar sounding words can be transcribed inadequately or may not  be corrected upon review.  10/29/2019, 10:49 AM

## 2019-11-12 ENCOUNTER — Other Ambulatory Visit: Payer: Self-pay | Admitting: Women's Health

## 2019-11-12 ENCOUNTER — Telehealth: Payer: Self-pay | Admitting: Women's Health

## 2019-11-12 MED ORDER — NITROFURANTOIN MONOHYD MACRO 100 MG PO CAPS: 100.0000 mg | ORAL_CAPSULE | Freq: Two times a day (BID) | ORAL | 0 refills | Status: DC

## 2019-11-12 NOTE — Telephone Encounter (Signed)
Patient called stating that she used a bath bomb this weekend and she has a UTI. Pt would to know if Maudie Mercury could call her in something. Pharmacy [phone]

## 2020-02-11 ENCOUNTER — Telehealth: Payer: Self-pay | Admitting: "Endocrinology

## 2020-02-11 NOTE — Telephone Encounter (Signed)
Patient's mother reached out that Londin had a very bad migraine and blurred vision. She wanted her to see Dr Dorris Fetch, I spoke with Dr Dorris Fetch who advised me that she needs to follow up with her PMD for this. Mother made aware.

## 2020-03-05 ENCOUNTER — Other Ambulatory Visit: Payer: Self-pay | Admitting: "Endocrinology

## 2020-04-23 DIAGNOSIS — M25532 Pain in left wrist: Secondary | ICD-10-CM | POA: Diagnosis not present

## 2020-04-24 ENCOUNTER — Other Ambulatory Visit: Payer: Self-pay | Admitting: "Endocrinology

## 2020-04-24 DIAGNOSIS — E221 Hyperprolactinemia: Secondary | ICD-10-CM | POA: Diagnosis not present

## 2020-04-25 LAB — VITAMIN D PNL(25-HYDRXY+1,25-DIHY)-BLD
Vit D, 1,25-Dihydroxy: 43.9 pg/mL (ref 19.9–79.3)
Vit D, 25-Hydroxy: 28.7 ng/mL — ABNORMAL LOW (ref 30.0–100.0)

## 2020-04-25 LAB — PROLACTIN: Prolactin: 8.1 ng/mL (ref 4.8–23.3)

## 2020-05-02 ENCOUNTER — Encounter: Payer: Self-pay | Admitting: "Endocrinology

## 2020-05-02 ENCOUNTER — Other Ambulatory Visit: Payer: Self-pay

## 2020-05-02 ENCOUNTER — Telehealth (INDEPENDENT_AMBULATORY_CARE_PROVIDER_SITE_OTHER): Payer: 59 | Admitting: "Endocrinology

## 2020-05-02 VITALS — Ht 68.0 in | Wt 135.0 lb

## 2020-05-02 DIAGNOSIS — E221 Hyperprolactinemia: Secondary | ICD-10-CM | POA: Diagnosis not present

## 2020-05-02 NOTE — Progress Notes (Signed)
05/02/2020                                        Endocrinology Telehealth Visit Follow up Note -During COVID -19 Pandemic  This visit type was conducted  via telephone due to national recommendations for restrictions regarding the COVID-19 Pandemic  in an effort to limit this patient's exposure and mitigate transmission of the corona virus.   I connected with Katherine Howe on 05/02/2020   by telephone and verified that I am speaking with the correct person using two identifiers. Katherine Howe, 2001/05/16. she has verbally consented to this visit.  I was in my office and patient was in her college residence. No other persons were with me during the encounter. All issues noted in this document were discussed and addressed. The format was not optimal for physical exam.    Subjective:    Patient ID: Katherine Howe, female    DOB: [birth year], PCP Lemmie Evens, MD   Past Medical History:  Diagnosis Date  . Dizziness   . Headache    Past Surgical History:  Procedure Laterality Date  . TOOTH EXTRACTION     5th-6th grade  . WISDOM TOOTH EXTRACTION  2017   Social History   Socioeconomic History  . Marital status: Single    Spouse name: Not on file  . Number of children: 0  . Years of education: 25  . Highest education level: Not on file  Occupational History  . 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 Smoking status: Never Smoker  . Smokeless tobacco: Never Used  Vaping Use  . Vaping Use: Never used  Substance and Sexual Activity  . Alcohol use: No  . Drug use: No  . Sexual activity: Never    Birth control/protection: None  Other Topics Concern  . Not on file  Social History Narrative   Lives at home w/ her family   Right-handed   Caffeine: drinks 1 diet drink per day   Social Determinants of Health   Financial Resource Strain:   . Difficulty of Paying Living Expenses: Not on file  Food Insecurity:   . Worried About Charity fundraiser in the  Last Year: Not on file  . Ran Out of Food in the Last Year: Not on file  Transportation Needs:   . Lack of Transportation (Medical): Not on file  . Lack of Transportation (Non-Medical): Not on file  Physical Activity:   . Days of Exercise per Week: Not on file  . Minutes of Exercise per Session: Not on file  Stress:   . Feeling of Stress : Not on file  Social Connections:   . Frequency of Communication with Friends and Family: Not on file  . Frequency of Social Gatherings with Friends and Family: Not on file  . Attends Religious Services: Not on file  . Active Member of Clubs or Organizations: Not on file  . Attends Archivist Meetings: Not on file  . Marital Status: Not on file   Outpatient Encounter Medications as of 05/02/2020  Medication Sig  . cabergoline (DOSTINEX) 0.5 MG tablet Take 0.5 tablets (0.25 mg total) by mouth 2 (two) times a week. (Patient taking differently: Take 0.25 mg by mouth once a week. )  . ibuprofen (ADVIL,MOTRIN) 200 MG tablet Take 400 mg by mouth every 6 (six) hours as needed.  . [  DISCONTINUED] nitrofurantoin, macrocrystal-monohydrate, (MACROBID) 100 MG capsule Take 1 capsule (100 mg total) by mouth 2 (two) times daily. X 7 days  . [DISCONTINUED] predniSONE (STERAPRED UNI-PAK 21 TAB) 10 MG (21) TBPK tablet As directed x 6 days  . [DISCONTINUED] Vitamin D, Ergocalciferol, (DRISDOL) 1.25 MG (50000 UNIT) CAPS capsule Take 1 capsule (50,000 Units total) by mouth every 7 (seven) days.   No facility-administered encounter medications on file as of 05/02/2020.   ALLERGIES: Allergies  Allergen Reactions  . Zithromax [Azithromycin] Hives    VACCINATION STATUS:  There is no immunization history on file for this patient.  HPI   19 year old female patient with medical history as above. She is being seen in f/u for  hyperprolactinemia associated with pituitary hyperplasia confirmed by MRI on 3 separate occasions, since 11-22-2016.  -She is currently on  cabergoline 0.25 mg p.o. once a week for hyperprolactinemia.       Her previsit prolactin level is controlled at 8.1 although its increasing from 1.2 during her last measurement.  Overall it is improving from 28.7.  She has no symptoms at this point.   Her prolactin maximum of 30.2 prior to initiation of treatment with bromocriptine.    She last underwent MRI of the brain in November 2018 due to headaches, dizziness. This study was reported to show similar findings with her prior studies including 2 MRIs done in 11/22/16.   The second MRI in 11-22-2016 confirmed pituitary height of 11.3 with no discrete adenoma however possible mass effect upon overlying optic chiasm, causing mild flattening of the optic nerves bilaterally.  No micro or macroadenoma was reported.  - She reports clinical improvement with no further visual complaints nor headaches. - She denies galactorrhea, and her menstrual cycles are normal. - She is in college majoring in business , not planning any pregnancy in the near future.    Review of Systems Limited: As above.   Objective:    Ht 5\' 8"  (1.727 m)   Wt 135 lb (61.2 kg)   BMI 20.53 kg/m   Wt Readings from Last 3 Encounters:  05/02/20 135 lb (61.2 kg)  10/29/19 133 lb (60.3 kg)  08/14/18 123 lb (55.8 kg) (42 %, Z= -0.21)*   * Growth percentiles are based on CDC (Girls, 2-20 Years) data.    Physical Exam   Recent Results (from the past 2158-11-23 hour(s))  Vitamin D pnl(25-hydrxy+1,25-dihy)-bld     Status: Abnormal   Collection Time: 04/24/20  3:23 PM  Result Value Ref Range   Vit D, 1,25-Dihydroxy 43.9 19.9 - 79.3 pg/mL   Vit D, 25-Hydroxy 28.7 (L) 30.0 - 100.0 ng/mL    Comment: Vitamin D deficiency has been defined by the [place] practice guideline as a level of serum 25-OH vitamin D less than 20 ng/mL (1,2). The Endocrine Society went on to further define vitamin D insufficiency as a level between 21 and 29 ng/mL  (2). 1. IOM (Institute of Medicine). 11-22-2008. Dietary reference    intakes for calcium and D. [place]: The    [employer]. 2. Holick MF, Binkley [place], Bischoff-Ferrari HA, et al.    Evaluation, treatment, and prevention of vitamin D    deficiency: an Endocrine Society clinical practice    guideline. JCEM. 2011 Jul; 96(7):1911-30.   Prolactin     Status: None   Collection Time: 04/24/20  3:23 PM  Result Value Ref Range   Prolactin 8.1 4.8 - 23.3  ng/mL    Assessment & Plan:   1. Hyperprolactinemia ([place]) 2. Vitamin D Deficiency  -The official report of the MRI is that of pituitary hyperplasia  ( with no distinct adenoma) to significant height of 11.3 mm ( normal at 9 mm) with local mass effect on the optic chiasm causing clinically significant blurry vision/headaches.  -  She has felt better since her last visit. She switched to cabergoline since her last visit currently at 0.25 mg p.o. once a week.  She is advised to stay on same dose same frequency for now.   - She will continue to benefit from low-dose cabergoline with a goal of  keeping prolactin in normal range for at least 2 years provided that her surveillance MRI does not show any nodular lesions.     -If she remains clinically stable, she would not require pituitary/sella MRI for now.  - However, if she starts to have sudden visual change or worsening headache, she may need repeat MRI sooner.   For vitamin D deficiency, she is status post vitamin D2 50,000 units for 4 weeks, sufficient  at 28.7.    Her other endocrine functions are  within normal limits.   -Please fax a copy of this note to Dr. Sarina Ill , neurology [facility] and Dr. Sharol Roussel, Optometry.  - I advised patient to maintain close follow up with Lemmie Evens, MD for primary care needs.      - Time spent on this patient care encounter:  20 minutes of which 50% was spent in  counseling and the rest reviewing  her current and  previous  labs / studies and medications  doses and developing a plan for long term care. Jon Kasparek Gunther  participated in the discussions, expressed understanding, and voiced agreement with the above plans.  All questions were answered to her satisfaction. she is encouraged to contact clinic should she have any questions or concerns prior to her return visit.    Follow up plan: Return in about 4 months (around 08/18/2020) for F/U with Pre-visit Labs.  Glade Lloyd, MD Phone: [phone]  Fax: [phone]  -  This note was partially dictated with voice recognition software. Similar sounding words can be transcribed inadequately or may not  be corrected upon review.  05/02/2020, 8:54 AM

## 2020-05-07 ENCOUNTER — Telehealth: Payer: Self-pay

## 2020-05-07 NOTE — Telephone Encounter (Signed)
Yes, she can.

## 2020-05-07 NOTE — Telephone Encounter (Signed)
Patient's mom is asking if she can do her next visit in 3 months instead of 4 due to her being home from college in December. Can she do like December 20th instead

## 2020-07-29 MED FILL — CABERGOLINE 0.5 MG TABS: 0.5 | 84 days supply | Qty: 12 | Fill #1

## 2020-08-05 DIAGNOSIS — E221 Hyperprolactinemia: Secondary | ICD-10-CM | POA: Diagnosis not present

## 2020-08-06 LAB — PROLACTIN: Prolactin: 3.7 ng/mL — ABNORMAL LOW (ref 4.8–23.3)

## 2020-08-11 ENCOUNTER — Ambulatory Visit: Payer: 59 | Admitting: "Endocrinology

## 2020-08-13 ENCOUNTER — Ambulatory Visit (INDEPENDENT_AMBULATORY_CARE_PROVIDER_SITE_OTHER): Payer: 59 | Admitting: "Endocrinology

## 2020-08-13 ENCOUNTER — Other Ambulatory Visit: Payer: Self-pay

## 2020-08-13 ENCOUNTER — Ambulatory Visit: Payer: 59 | Admitting: "Endocrinology

## 2020-08-13 ENCOUNTER — Encounter: Payer: Self-pay | Admitting: "Endocrinology

## 2020-08-13 VITALS — BP 114/76 | HR 73 | Ht 68.0 in | Wt 135.2 lb

## 2020-08-13 DIAGNOSIS — E221 Hyperprolactinemia: Secondary | ICD-10-CM

## 2020-08-13 DIAGNOSIS — H5203 Hypermetropia, bilateral: Secondary | ICD-10-CM | POA: Diagnosis not present

## 2020-08-13 DIAGNOSIS — D3131 Benign neoplasm of right choroid: Secondary | ICD-10-CM | POA: Diagnosis not present

## 2020-08-13 DIAGNOSIS — H52223 Regular astigmatism, bilateral: Secondary | ICD-10-CM | POA: Diagnosis not present

## 2020-08-13 NOTE — Progress Notes (Signed)
08/13/2020                    Endocrinology follow-up note   Subjective:    Patient ID: Katherine Howe, female    DOB: 08/24/2000, PCP Katherine Evens, MD   Past Medical History:  Diagnosis Date  . Dizziness   . Headache    Past Surgical History:  Procedure Laterality Date  . TOOTH EXTRACTION     5th-6th grade  . WISDOM TOOTH EXTRACTION  2017   Social History   Socioeconomic History  . Marital status: Single    Spouse name: Not on file  . Number of children: 0  . Years of education: 60  . Highest education level: Not on file  Occupational History  . Occupation: Katherine Howe  Tobacco Use  . Smoking status: Never Smoker  . Smokeless tobacco: Never Used  Vaping Use  . Vaping Use: Never used  Substance and Sexual Activity  . Alcohol use: No  . Drug use: No  . Sexual activity: Never    Birth control/protection: None  Other Topics Concern  . Not on file  Social History Narrative   Lives at home w/ her family   Right-handed   Caffeine: drinks 1 diet drink per day   Social Determinants of Health   Financial Resource Strain: Not on file  Food Insecurity: Not on file  Transportation Needs: Not on file  Physical Activity: Not on file  Stress: Not on file  Social Connections: Not on file   Outpatient Encounter Medications as of 08/13/2020  Medication Sig  . cabergoline (DOSTINEX) 0.5 MG tablet Take 0.5 tablets (0.25 mg total) by mouth 2 (two) times a week. (Patient taking differently: Take 0.25 mg by mouth once a week. )  . ibuprofen (ADVIL,MOTRIN) 200 MG tablet Take 400 mg by mouth every 6 (six) hours as needed.   No facility-administered encounter medications on file as of 08/13/2020.   ALLERGIES: Allergies  Allergen Reactions  . Zithromax [Azithromycin] Hives    VACCINATION STATUS:  There is no immunization history on file for this patient.  HPI   19 year old female patient with medical history as above. She is being seen in follow-up for   hyperprolactinemia associated with pituitary hyperplasia confirmed by MRI on 3 separate occasions, since 28-Oct-2016.  -She is currently on cabergoline 0.25 mg p.o. once a week for hyperprolactinemia.  .       Her previsit prolactin level is controlled at 3.7, overall improving from 28.7.  She has no symptoms at the moment.  She has normal menstrual cycles.    Her prolactin maximum of 30.2 prior to initiation of treatment with bromocriptine.    She last underwent MRI of the brain in November 2018 due to headaches, dizziness. This study was reported to show similar findings with her prior studies including 2 MRIs done in 2016-10-28.   The second MRI in 10-28-16 confirmed pituitary height of 11.3 with no discrete adenoma however possible mass effect upon overlying optic chiasm, causing mild flattening of the optic nerves bilaterally.  No micro or macroadenoma was reported.  - She reports clinical improvement with no further visual complaints nor headaches. - She denies galactorrhea. - She is in college majoring in business , not planning any pregnancy in the near future.    Review of Systems Limited: As above.   Objective:    BP 114/76   Pulse 73   Ht 5\' 8"  (1.727 m)  Wt 135 lb 3.2 oz (61.3 kg)   BMI 20.56 kg/m   Wt Readings from Last 3 Encounters:  08/13/20 135 lb 3.2 oz (61.3 kg)  05/02/20 135 lb (61.2 kg)  10/29/19 133 lb (60.3 kg)    Physical Exam   Recent Results (from the past 2160 hour(s))  Prolactin     Status: Abnormal   Collection Time: 08/05/20 11:18 AM  Result Value Ref Range   Prolactin 3.7 (L) 4.8 - 23.3 ng/mL    Assessment & Plan:   1. Hyperprolactinemia ([place]) 2. Vitamin D Deficiency  -The official report of the MRI is that of pituitary hyperplasia  ( with no distinct adenoma) to significant height of 11.3 mm ( normal at 9 mm) with local mass effect on the optic chiasm causing clinically significant blurry vision/headaches.  -  She maintains controlled  prolactin at 3.7.  - She will continue to benefit from low-dose cabergoline with a goal of  keeping prolactin in normal range for at least 2 years provided that her surveillance MRI does not show any nodular lesions.     She is advised to continue cabergoline 0.25 mg p.o. weekly with plan to repeat prolactin in office visit in 4-56-month.   -If she remains clinically stable, she would not require pituitary/sella MRI for now.  - However, if she starts to have sudden visual change or worsening headache, she may need repeat MRI sooner.   For vitamin D deficiency, she is status post vitamin D2 50,000 units for 4 weeks, sufficient  at 28.7.    Her other endocrine functions are  within normal limits.   -Please fax a copy of this note to Katherine Howe , neurology [facility] and Katherine Howe, Optometry.  - I advised patient to maintain close follow up with Katherine Evens, MD for primary care needs.     - Time spent on this patient care encounter:  20 minutes of which 50% was spent in  counseling and the rest reviewing  her current and  previous labs / studies and medications  doses and developing a plan for long term care. Katherine Howe  participated in the discussions, expressed understanding, and voiced agreement with the above plans.  All questions were answered to her satisfaction. she is encouraged to contact clinic should she have any questions or concerns prior to her return visit.   Follow up plan: Return in about 6 months (around 02/11/2021) for F/U with Pre-visit Labs.  Katherine Lloyd, MD Phone: [phone]  Fax: [phone]  -  This note was partially dictated with voice recognition software. Similar sounding words can be transcribed inadequately or may not  be corrected upon review.  08/13/2020, 2:52 PM

## 2020-09-02 ENCOUNTER — Ambulatory Visit: Payer: 59 | Admitting: "Endocrinology

## 2021-01-22 ENCOUNTER — Ambulatory Visit: Payer: 59 | Admitting: "Endocrinology

## 2021-01-26 ENCOUNTER — Ambulatory Visit: Payer: 59 | Admitting: "Endocrinology

## 2021-02-09 DIAGNOSIS — E221 Hyperprolactinemia: Secondary | ICD-10-CM | POA: Diagnosis not present

## 2021-02-10 LAB — PROLACTIN: Prolactin: 3.2 ng/mL — ABNORMAL LOW (ref 4.8–23.3)

## 2021-02-11 ENCOUNTER — Ambulatory Visit (INDEPENDENT_AMBULATORY_CARE_PROVIDER_SITE_OTHER): Payer: 59 | Admitting: "Endocrinology

## 2021-02-11 ENCOUNTER — Other Ambulatory Visit (HOSPITAL_COMMUNITY): Payer: Self-pay

## 2021-02-11 ENCOUNTER — Other Ambulatory Visit: Payer: Self-pay

## 2021-02-11 ENCOUNTER — Encounter: Payer: Self-pay | Admitting: "Endocrinology

## 2021-02-11 VITALS — BP 96/58 | HR 68 | Ht 68.0 in | Wt 134.4 lb

## 2021-02-11 DIAGNOSIS — E221 Hyperprolactinemia: Secondary | ICD-10-CM | POA: Diagnosis not present

## 2021-02-11 MED ORDER — CABERGOLINE 0.5 MG PO TABS
0.2500 mg | ORAL_TABLET | ORAL | 1 refills | Status: DC
  Filled 2021-02-11 – 2021-03-24 (×2): qty 12, 84d supply, fill #0

## 2021-02-11 NOTE — Progress Notes (Signed)
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  Tobacco Use   Smoking status: Never   Smokeless tobacco: Never  Vaping Use   Vaping Use: Never used  Substance and Sexual Activity   Alcohol use: No   Drug use: No   Sexual activity: Never    Birth control/protection: None  Other Topics Concern   Not on file  Social History Narrative   Lives at home w/ her family   Right-handed   Caffeine: drinks 1 diet drink per day   Social Determinants of Health   Financial Resource Strain: Not on file  Food Insecurity: Not on file  Transportation Needs: Not on file  Physical Activity: Not on file  Stress: Not on file  Social Connections: Not on file   Outpatient Encounter Medications as of 02/11/2021  Medication Sig   [START ON 02/12/2021] cabergoline (DOSTINEX) 0.5 MG tablet Take 0.5 tablets (0.25 mg total) by mouth 2 (two) times a week.   ibuprofen (ADVIL,MOTRIN) 200 MG tablet Take 400 mg by mouth every 6 (six) hours as needed.   [DISCONTINUED] cabergoline (DOSTINEX) 0.5 MG tablet Take 0.5 tablets (0.25 mg total) by mouth 2 (two) times a week. (Patient taking differently: Take 0.25 mg by mouth once a week. )   No facility-administered encounter medications on file as of 02/11/2021.   ALLERGIES: Allergies  Allergen Reactions   Zithromax [Azithromycin] Hives    VACCINATION STATUS:  There is no immunization history on file for this  patient.  HPI   20 year old female patient with medical history as above. She is being seen in follow-up for  hyperprolactinemia associated with pituitary hyperplasia confirmed by MRI on 3 separate occasions, since 11-21-16.  -She is currently on cabergoline 0.25 mg p.o. once a week for hyperprolactinemia.  .       Her previsit prolactin level is controlled at 3.2 , overall improving from 28.7.  She has no symptoms at the moment.  She has normal menstrual cycles.    Her prolactin maximum of 30.2 prior to initiation of treatment with bromocriptine.    She last underwent MRI of the brain in November 2018 due to headaches, dizziness. This study was reported to show similar findings with her prior studies including 2 MRIs done in Nov 21, 2016.   The second MRI in Nov 21, 2016 confirmed pituitary height of 11.3 with no discrete adenoma however possible mass effect upon overlying optic chiasm, causing mild flattening of the optic nerves bilaterally.  No micro or macroadenoma was reported.  - She reports clinical improvement with no further visual complaints nor headaches. - She denies galactorrhea. - She is in college majoring in business , not planning any pregnancy in the near future.    Review of Systems Limited:  As above.   Objective:    BP (!) 96/58   Pulse 68   Ht 5\' 8"  (1.727 m)   Wt 134 lb 6.4 oz (61 kg)   BMI 20.44 kg/m   Wt Readings from Last 3 Encounters:  02/11/21 134 lb 6.4 oz (61 kg)  08/13/20 135 lb 3.2 oz (61.3 kg)  05/02/20 135 lb (61.2 kg)    Physical Exam   Recent Results (from the past 2160 hour(s))  Prolactin     Status: Abnormal   Collection Time: 02/09/21  2:04 PM  Result Value Ref Range   Prolactin 3.2 (L) 4.8 - 23.3 ng/mL    Assessment & Plan:   1. Hyperprolactinemia ([place]) 2. Vitamin D Deficiency  -The official report of the MRI is that of pituitary hyperplasia  ( with no distinct adenoma) to significant height of 11.3 mm ( normal at 9 mm) with local  mass effect on the optic chiasm causing clinically significant blurry vision/headaches.  -  She maintains controlled prolactin at 3.2.  - She will continue to benefit from low-dose cabergoline with a goal of   keeping prolactin in normal range for at least 2 years provided that her surveillance MRI does not show any nodular lesions.     She is advised to continue cabergoline 0.25 mg p.o. weekly with plan to repeat prolactin in office visit in 47-month.   -If she remains clinically stable, she would not require pituitary/sella MRI for now.  - However, if she starts to have sudden visual change or worsening headache, she may need repeat MRI sooner.   For vitamin D deficiency, she is status post vitamin D2 50,000 units for 4 weeks, sufficient  at 28.7.    Her other endocrine functions are  within normal limits.   -Please fax a copy of this note to Dr. Sarina Ill , neurology [facility] and Dr. Sharol Roussel, Optometry.  - I advised patient to maintain close follow up with Lemmie Evens, MD for primary care needs.    I spent 25 minutes in the care of the patient today including review of labs from Thyroid Function, CMP, and other relevant labs ; imaging/biopsy records (current and previous including abstractions from other facilities); face-to-face time discussing  her lab results and symptoms, medications doses, her options of short and long term treatment based on the latest standards of care / guidelines;   and documenting the encounter.  Earnestene Angello Shieh  participated in the discussions, expressed understanding, and voiced agreement with the above plans.  All questions were answered to her satisfaction. she is encouraged to contact clinic should she have any questions or concerns prior to her return visit.   Follow up plan: Return in about 6 months (around 08/13/2021) for F/U with Pre-visit Labs.  Glade Lloyd, MD Phone: [phone]  Fax: [phone]  -  This note was partially  dictated with voice recognition software. Similar sounding words can be transcribed inadequately or may not  be corrected upon review.  02/11/2021, 12:55 PM

## 2021-02-19 ENCOUNTER — Other Ambulatory Visit (HOSPITAL_COMMUNITY): Payer: Self-pay

## 2021-03-24 ENCOUNTER — Other Ambulatory Visit (HOSPITAL_COMMUNITY): Payer: Self-pay

## 2021-04-06 ENCOUNTER — Other Ambulatory Visit (HOSPITAL_COMMUNITY): Payer: Self-pay

## 2021-08-05 ENCOUNTER — Ambulatory Visit: Payer: 59 | Admitting: "Endocrinology

## 2021-08-05 DIAGNOSIS — E221 Hyperprolactinemia: Secondary | ICD-10-CM | POA: Diagnosis not present

## 2021-08-06 LAB — PROLACTIN: Prolactin: 8.2 ng/mL (ref 4.8–23.3)

## 2021-08-11 ENCOUNTER — Ambulatory Visit (INDEPENDENT_AMBULATORY_CARE_PROVIDER_SITE_OTHER): Payer: 59 | Admitting: "Endocrinology

## 2021-08-11 ENCOUNTER — Encounter: Payer: Self-pay | Admitting: "Endocrinology

## 2021-08-11 ENCOUNTER — Other Ambulatory Visit (HOSPITAL_COMMUNITY): Payer: Self-pay

## 2021-08-11 ENCOUNTER — Other Ambulatory Visit: Payer: Self-pay

## 2021-08-11 VITALS — BP 104/62 | HR 80 | Ht 68.0 in | Wt 140.0 lb

## 2021-08-11 DIAGNOSIS — E221 Hyperprolactinemia: Secondary | ICD-10-CM

## 2021-08-11 MED ORDER — CABERGOLINE 0.5 MG PO TABS
0.2500 mg | ORAL_TABLET | ORAL | 1 refills | Status: DC
  Filled 2021-08-11: qty 12, 84d supply, fill #0
  Filled 2021-11-10: qty 12, 84d supply, fill #1

## 2021-08-11 NOTE — Progress Notes (Signed)
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  Tobacco Use   Smoking status: Never   Smokeless tobacco: Never  Vaping Use   Vaping Use: Never used  Substance and Sexual Activity   Alcohol use: No   Drug use: No   Sexual activity: Never    Birth control/protection: None  Other Topics Concern   Not on file  Social History Narrative   Lives at home w/ her family   Right-handed   Caffeine: drinks 1 diet drink per day   Social Determinants of Health   Financial Resource Strain: Not on file  Food Insecurity: Not on file  Transportation Needs: Not on file  Physical Activity: Not on file  Stress: Not on file  Social Connections: Not on file   Outpatient Encounter Medications as of 08/11/2021  Medication Sig   [START ON 08/13/2021] cabergoline (DOSTINEX) 0.5 MG tablet Take 0.5 tablets (0.25 mg total) by mouth 2 (two) times a week.   ibuprofen (ADVIL,MOTRIN) 200 MG tablet Take 400 mg by mouth every 6 (six) hours as needed.   [DISCONTINUED] cabergoline (DOSTINEX) 0.5 MG tablet Take 0.5 tablets (0.25 mg total) by mouth 2 (two) times a week. (Patient taking differently: Take 0.25 mg by mouth once a week.)   No facility-administered encounter medications on file as of 08/11/2021.   ALLERGIES: Allergies  Allergen Reactions   Zithromax [Azithromycin] Hives    VACCINATION STATUS:  There is no immunization history on file for this  patient.  HPI   20 year old female patient with medical history as above. She is being seen in follow-up for  hyperprolactinemia associated with pituitary hyperplasia confirmed by MRI on 3 separate occasions, since 21-Nov-2016.  -She is currently on cabergoline 0.25 mg p.o. once a week for hyperprolactinemia.  .       Her previsit prolactin level is controlled at 8.2, increasing from 3.2.  Overall she has improved from 30.2 prior to initiation of treatment . She has no symptoms at the moment.  She has normal menstrual cycles.     She last underwent MRI of the brain in November 2018 due to headaches, dizziness. This study was reported to show similar findings with her prior studies including 2 MRIs done in 11-21-2016.   The second MRI in 2016/11/21 confirmed pituitary height of 11.3 with no discrete adenoma however possible mass effect upon overlying optic chiasm, causing mild flattening of the optic nerves bilaterally.  No micro or macroadenoma was reported.  - She reports clinical improvement with no further visual complaints nor headaches. - She denies galactorrhea. - She is in college majoring in business , not planning any pregnancy in the near future.    Review of Systems Limited: As above.   Objective:  BP 104/62    Pulse 80    Ht 5\' 8"  (1.727 m)    Wt 140 lb (63.5 kg)    BMI 21.29 kg/m   Wt Readings from Last 3 Encounters:  08/11/21 140 lb (63.5 kg)  02/11/21 134 lb 6.4 oz (61 kg)  08/13/20 135 lb 3.2 oz (61.3 kg)    Physical Exam   Recent Results (from the past 2160 hour(s))  Prolactin     Status: None   Collection Time: 08/05/21 10:20 AM  Result Value Ref Range   Prolactin 8.2 4.8 - 23.3 ng/mL    Assessment & Plan:   1. Hyperprolactinemia ([place]) 2. Vitamin D Deficiency  -The official report of the MRI is that of pituitary hyperplasia  ( with no distinct adenoma) to significant height of 11.3 mm ( normal at 9 mm) with local mass effect on the optic chiasm causing  clinically significant blurry vision/headaches.  -  She maintains controlled prolactin at 8.2.   - She will continue to benefit from low-dose cabergoline with a goal of   keeping prolactin in normal range for at least 2 years provided that her surveillance MRI does not show any nodular lesions.     She is advised to increase cabergoline to 0.25 mg p.o. twice a week with plan to repeat prolactin in office visit in 38-month.   -If she remains clinically stable, she would not require pituitary/sella MRI for now.  - However, if she starts to have sudden visual change or worsening headache, she may need repeat MRI sooner.   For vitamin D deficiency, she is status post vitamin D2 50,000 units for 4 weeks, sufficient  at 28.7.    Her other endocrine functions are  within normal limits.   -Please fax a copy of this note to Dr. Sarina Ill , neurology [facility] and Dr. Sharol Roussel, Optometry.  - I advised patient to maintain close follow up with Lemmie Evens, MD for primary care needs.   I spent 21 minutes in the care of the patient today including review of labs from Thyroid Function, CMP, and other relevant labs ; imaging/biopsy records (current and previous including abstractions from other facilities); face-to-face time discussing  her lab results and symptoms, medications doses, her options of short and long term treatment based on the latest standards of care / guidelines;   and documenting the encounter.  [name]  participated in the discussions, expressed understanding, and voiced agreement with the above plans.  All questions were answered to her satisfaction. she is encouraged to contact clinic should she have any questions or concerns prior to her return visit.   Follow up plan: Return in about 6 months (around 02/09/2022) for F/U with Pre-visit Labs.  Glade Lloyd, MD Phone: [phone]  Fax: [phone]  -  This note was partially dictated with voice recognition  software. Similar sounding words can be transcribed inadequately or may not  be corrected upon review.  08/11/2021, 5:39 PM

## 2021-08-12 DIAGNOSIS — H5203 Hypermetropia, bilateral: Secondary | ICD-10-CM | POA: Diagnosis not present

## 2021-08-12 DIAGNOSIS — D3131 Benign neoplasm of right choroid: Secondary | ICD-10-CM | POA: Diagnosis not present

## 2021-08-12 DIAGNOSIS — H52223 Regular astigmatism, bilateral: Secondary | ICD-10-CM | POA: Diagnosis not present

## 2021-08-13 ENCOUNTER — Ambulatory Visit: Payer: 59 | Admitting: "Endocrinology

## 2021-11-10 ENCOUNTER — Other Ambulatory Visit (HOSPITAL_COMMUNITY): Payer: Self-pay

## 2022-01-23 LAB — PROLACTIN: Prolactin: 3.4 ng/mL — ABNORMAL LOW (ref 4.8–23.3)

## 2022-01-29 ENCOUNTER — Encounter: Payer: Self-pay | Admitting: "Endocrinology

## 2022-01-29 ENCOUNTER — Ambulatory Visit (INDEPENDENT_AMBULATORY_CARE_PROVIDER_SITE_OTHER): Payer: No Typology Code available for payment source | Admitting: "Endocrinology

## 2022-01-29 VITALS — BP 90/66 | HR 68 | Ht 68.0 in | Wt 143.0 lb

## 2022-01-29 DIAGNOSIS — E221 Hyperprolactinemia: Secondary | ICD-10-CM

## 2022-01-29 MED ORDER — CABERGOLINE 0.5 MG PO TABS: 0.5000 mg | ORAL_TABLET | ORAL | 1 refills | Status: DC

## 2022-01-29 NOTE — Progress Notes (Signed)
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  Tobacco Use   Smoking status: Never   Smokeless tobacco: Never  Vaping Use   Vaping Use: Never used  Substance and Sexual Activity   Alcohol use: No   Drug use: No   Sexual activity: Never    Birth control/protection: None  Other Topics Concern   Not on file  Social History Narrative   Lives at home w/ her family   Right-handed   Caffeine: drinks 1 diet drink per day   Social Determinants of Health   Financial Resource Strain: Not on file  Food Insecurity: Not on file  Transportation Needs: Not on file  Physical Activity: Not on file  Stress: Not on file  Social Connections: Not on file   Outpatient Encounter Medications as of 01/29/2022  Medication Sig   cabergoline (DOSTINEX) 0.5 MG tablet Take 1 tablet (0.5 mg total) by mouth once a week.   ibuprofen (ADVIL,MOTRIN) 200 MG tablet Take 400 mg by mouth every 6 (six) hours as needed.   [DISCONTINUED] cabergoline (DOSTINEX) 0.5 MG tablet Take 0.5 tablets (0.25 mg total) by mouth 2 (two) times a week.   No facility-administered encounter medications on file as of 01/29/2022.   ALLERGIES: Allergies  Allergen Reactions   Zithromax [Azithromycin] Hives    VACCINATION STATUS:  There is no immunization history on file for this patient.  HPI   21 year old female patient with medical history as above. She is being seen in follow-up for  hyperprolactinemia associated with pituitary hyperplasia  confirmed by MRI on 3 separate occasions, since 11/14/16.  -She is currently on cabergoline 0.25 mg p.o. twice a week for hyperprolactinemia.         Her previsit prolactin level is controlled at 3.4, improving from 8.2. Overall she has improved from 30.2 prior to initiation of treatment . She has no symptoms at the moment.  She has normal/regular menstrual cycles.  She is not planning for pregnancy anytime soon.       She last underwent MRI of the brain in November 2018 due to headaches, dizziness. This study was reported to show similar findings with her prior studies including 2 MRIs done in Nov 14, 2016.   The second MRI in 11-14-2016 confirmed pituitary height of 11.3 with no discrete adenoma however possible mass effect upon overlying optic chiasm, causing mild flattening of the optic nerves bilaterally.  No micro or macroadenoma was reported.  - She reports clinical improvement with no further visual complaints nor headaches. - She denies galactorrhea. - She is in college majoring in business , not planning any pregnancy in the near future.    Review of Systems Limited: As above.   Objective:    BP 90/66   Pulse  68   Ht '5\' 8"'$  (1.727 m)   Wt 143 lb (64.9 kg)   BMI 21.74 kg/m   Wt Readings from Last 3 Encounters:  01/29/22 143 lb (64.9 kg)  08/11/21 140 lb (63.5 kg)  02/11/21 134 lb 6.4 oz (61 kg)    Physical Exam   Recent Results (from the past 2160 hour(s))  Prolactin     Status: Abnormal   Collection Time: 01/22/22  2:11 PM  Result Value Ref Range   Prolactin 3.4 (L) 4.8 - 23.3 ng/mL    Assessment & Plan:   1. Hyperprolactinemia ([place]) 2. Vitamin D Deficiency  -The official report of the MRI is that of pituitary hyperplasia  ( with no distinct adenoma) to significant height of 11.3 mm ( normal at 9 mm) with local mass effect on the optic chiasm causing clinically significant blurry vision/headaches.  -  She  maintains controlled prolactin at 3.4.  She will continue to benefit from low-dose cabergoline with a goal of   keeping prolactin in normal range for at least 2 years provided that her surveillance MRI does not show any nodular lesions.     She is advised to adjust her cabergoline to 0.5 mg once a week with plan to repeat prolactin and office visit in 49-month   -If she remains clinically stable, she would not require pituitary/sella MRI for now.  - However, if she starts to have sudden visual change or worsening headache, she may need repeat MRI sooner.   For vitamin D deficiency, she is status post vitamin D2 50,000 units for 4 weeks, sufficient  at 28.7.    Her other endocrine functions are  within normal limits.   -Please fax a copy of this note [facility] J[facility]arol Roussel [facility].  - I advised patient to maintain close follow up with KLemmie Evens MD for primary care needs.    I spent 20 minutes in the care of the patient today including review of labs from Thyroid Function, CMP, and other relevant labs ; imaging/biopsy records (current and previous including abstractions from other facilities); face-to-face time discussing  her lab results and symptoms, medications doses, her options of short and long term treatment based on the latest standards of care / guidelines;   and documenting the encounter.  [name]  participated in the discussions, expressed understanding, and voiced agreement with the above plans.  All questions were answered to her satisfaction. she is encouraged to contact clinic should she have any questions or concerns prior to her return visit.   Follow up plan: Return in about 6 months (around 07/31/2022) for F/U with Pre-visit Labs.  GGlade Lloyd MD Phone: [phone] Fax: [phone] -  This note was partially dictated with voice recognition software. Similar sounding words can be transcribed inadequately or  may not  be corrected upon review.  01/29/2022, 11:56 AM

## 2022-02-12 ENCOUNTER — Ambulatory Visit: Payer: 59 | Admitting: "Endocrinology

## 2022-05-28 ENCOUNTER — Other Ambulatory Visit (HOSPITAL_COMMUNITY): Payer: Self-pay

## 2022-05-28 ENCOUNTER — Other Ambulatory Visit: Payer: Self-pay | Admitting: *Deleted

## 2022-05-28 MED ORDER — CABERGOLINE 0.5 MG PO TABS: 0.5000 mg | ORAL_TABLET | ORAL | 0 refills | Status: DC

## 2022-08-10 DIAGNOSIS — E221 Hyperprolactinemia: Secondary | ICD-10-CM | POA: Diagnosis not present

## 2022-08-11 LAB — PROLACTIN: Prolactin: 0.8 ng/mL — ABNORMAL LOW (ref 4.8–33.4)

## 2022-08-12 ENCOUNTER — Ambulatory Visit: Payer: No Typology Code available for payment source | Admitting: "Endocrinology

## 2022-08-13 ENCOUNTER — Telehealth: Payer: Self-pay | Admitting: "Endocrinology

## 2022-08-13 ENCOUNTER — Other Ambulatory Visit: Payer: Self-pay

## 2022-08-13 DIAGNOSIS — E221 Hyperprolactinemia: Secondary | ICD-10-CM

## 2022-08-13 MED ORDER — CABERGOLINE 0.5 MG PO TABS
0.2500 mg | ORAL_TABLET | ORAL | 1 refills | Status: DC
  Filled 2022-08-13: qty 6, 84d supply, fill #0
  Filled 2023-01-25: qty 6, 84d supply, fill #1

## 2022-08-13 NOTE — Telephone Encounter (Signed)
Pt missed her appointment yesterday and we have not had any openings to get her worked in but she did have her labs done. I wanted to make sure you did not want to change anything before I refilled her medication because she will not be back in town until later next year.

## 2022-08-13 NOTE — Telephone Encounter (Signed)
Discussed with pt, understanding voiced. Rx refill sent.

## 2022-08-13 NOTE — Telephone Encounter (Signed)
New message     1. Which medications need to be refilled? (please list name of each medication and dose if known) cabergoline (DOSTINEX) 0.5 MG tablet   2. Which pharmacy/location (including street and city if local pharmacy) is medication to be sent to? [facility]    3. Do they need a 30 day or 90 day supply? 90 day supply

## 2022-08-18 ENCOUNTER — Other Ambulatory Visit (HOSPITAL_COMMUNITY): Payer: Self-pay

## 2022-10-22 ENCOUNTER — Other Ambulatory Visit (HOSPITAL_COMMUNITY): Payer: Self-pay

## 2022-10-25 DIAGNOSIS — H52223 Regular astigmatism, bilateral: Secondary | ICD-10-CM | POA: Diagnosis not present

## 2022-10-25 DIAGNOSIS — H5203 Hypermetropia, bilateral: Secondary | ICD-10-CM | POA: Diagnosis not present

## 2022-10-25 DIAGNOSIS — D3131 Benign neoplasm of right choroid: Secondary | ICD-10-CM | POA: Diagnosis not present

## 2022-11-26 ENCOUNTER — Other Ambulatory Visit (HOSPITAL_COMMUNITY): Payer: Self-pay

## 2023-01-25 ENCOUNTER — Other Ambulatory Visit: Payer: Self-pay | Admitting: "Endocrinology

## 2023-01-25 ENCOUNTER — Other Ambulatory Visit (HOSPITAL_COMMUNITY): Payer: Self-pay

## 2023-01-25 DIAGNOSIS — E221 Hyperprolactinemia: Secondary | ICD-10-CM

## 2023-01-27 ENCOUNTER — Other Ambulatory Visit (HOSPITAL_COMMUNITY): Payer: Self-pay

## 2023-01-27 ENCOUNTER — Other Ambulatory Visit: Payer: Self-pay | Admitting: "Endocrinology

## 2023-01-27 ENCOUNTER — Other Ambulatory Visit: Payer: Self-pay

## 2023-01-27 DIAGNOSIS — E221 Hyperprolactinemia: Secondary | ICD-10-CM

## 2023-01-27 MED ORDER — CABERGOLINE 0.5 MG PO TABS: 0.2500 mg | ORAL_TABLET | ORAL | 0 refills | Status: DC

## 2023-02-23 DIAGNOSIS — E221 Hyperprolactinemia: Secondary | ICD-10-CM | POA: Diagnosis not present

## 2023-02-24 LAB — PROLACTIN: Prolactin: 3.7 ng/mL — ABNORMAL LOW (ref 4.8–33.4)

## 2023-02-24 LAB — VITAMIN D 25 HYDROXY (VIT D DEFICIENCY, FRACTURES): Vit D, 25-Hydroxy: 32.1 ng/mL (ref 30.0–100.0)

## 2023-02-28 ENCOUNTER — Encounter: Payer: Self-pay | Admitting: "Endocrinology

## 2023-02-28 ENCOUNTER — Ambulatory Visit (INDEPENDENT_AMBULATORY_CARE_PROVIDER_SITE_OTHER): Payer: 59 | Admitting: "Endocrinology

## 2023-02-28 VITALS — BP 108/70 | HR 68 | Ht 68.0 in | Wt 151.0 lb

## 2023-02-28 DIAGNOSIS — E221 Hyperprolactinemia: Secondary | ICD-10-CM | POA: Diagnosis not present

## 2023-02-28 NOTE — Progress Notes (Signed)
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[employer]  Tobacco Use   Smoking status: Never   Smokeless tobacco: Never  Vaping Use   Vaping Use: Never used  Substance and Sexual Activity   Alcohol use: No   Drug use: No   Sexual activity: Never    Birth control/protection: None  Other Topics Concern   Not on file  Social History Narrative   Lives at home w/ her family   Right-handed   Caffeine: drinks 1 diet drink per day   Social Determinants of Health   Financial Resource Strain: Not on file  Food Insecurity: Not on file  Transportation Needs: Not on file  Physical Activity: Not on file  Stress: Not on file  Social Connections: Not on file   Outpatient Encounter Medications as of 02/28/2023  Medication Sig   cabergoline (DOSTINEX) 0.5 MG tablet Take 0.5 tablets (0.25 mg total) by mouth once a week.   ibuprofen (ADVIL,MOTRIN) 200 MG tablet Take 400 mg by mouth every 6 (six) hours as needed.   No facility-administered encounter medications on file as of 02/28/2023.   ALLERGIES: Allergies  Allergen Reactions   Zithromax [Azithromycin] Hives    VACCINATION STATUS:  There is no immunization history on file for this patient.  HPI   22 year old female patient with medical history as above. She is being seen in follow-up for hyperprolactinemia associated with pituitary hyperplasia  confirmed by MRI on 3 separate occasions, since 11/08/16.   -She is currently on cabergoline 0.25 mg p.o. once a week for hyperprolactinemia.         Her previsit prolactin level is controlled at 3.7, overall she has improved her prolactin from 30.2 prior to initiation of treatment . She has no symptoms at the moment.  She has normal/regular menstrual cycles.  She is not planning for pregnancy anytime soon.       She last underwent MRI of the brain in November 2018 due to headaches, dizziness. This study was reported to show similar findings with her prior studies including 2 MRIs done in 11-08-2016.   The second MRI in 03-19-18confirmed pituitary height of 11.3 with no discrete adenoma however possible mass effect upon overlying optic chiasm, causing mild flattening of the optic nerves bilaterally.  No micro or macroadenoma was reported.  - She reports clinical improvement with no further visual complaints nor headaches. - She denies galactorrhea. - She is in college majoring in business , not planning any pregnancy in the near future.    Review of Systems Limited: As above.   Objective:    BP 108/70   Pulse 68   Ht 5\' 8"  (1.727 m)   Wt 151 lb (68.5 kg)   BMI 22.96 kg/m  Wt Readings from Last 3 Encounters:  02/28/23 151 lb (68.5 kg)  01/29/22 143 lb (64.9 kg)  08/11/21 140 lb (63.5 kg)    Physical Exam   Recent Results (from the past 2160 hour(s))  Prolactin     Status: Abnormal   Collection Time: 02/23/23 11:07 AM  Result Value Ref Range   Prolactin 3.7 (L) 4.8 - 33.4 ng/mL  Vitamin D, 25-hydroxy     Status: None   Collection Time: 02/23/23 11:07 AM  Result Value Ref Range   Vit D, 25-Hydroxy 32.1 30.0 - 100.0 ng/mL    Comment: Vitamin D deficiency has been defined by the Institute of Medicine and an Endocrine Society practice guideline as a level of serum 25-OH vitamin D less than 20 ng/mL (1,2). The Endocrine Society went on to further define vitamin D insufficiency as a level between 21 and 29 ng/mL (2). 1. IOM  (Institute of Medicine). 2010. Dietary reference    intakes for calcium and D. [place]: The    [employer]. 2. Holick MF, Binkley Madras, Bischoff-Ferrari HA, et al.    Evaluation, treatment, and prevention of vitamin D    deficiency: an Endocrine Society clinical practice    guideline. JCEM. 2011 Jul; 96(7):1911-30.     Assessment & Plan:   1. Hyperprolactinemia (HCC) 2. Vitamin D Deficiency  -The official report of the MRI is that of pituitary hyperplasia  ( with no distinct adenoma) to significant height of 11.3 mm ( normal at 9 mm) with local mass effect on the optic chiasm causing clinically significant blurry vision/headaches.  -  She maintains controlled prolactin at 3.7.  She will continue to benefit from low-dose cabergoline with a goal of   keeping prolactin in normal range for at least 2 years provided that her surveillance MRI does not show any nodular lesions.     She is advised to continue cabergoline 0.25 mg once a week until she finishes her current supply, and stay off of it until next measurement and office visit in 6 months.     -If she remains clinically stable, she would not require pituitary/sella MRI for now.  - However, if she starts to have sudden visual change or worsening headache, she may need repeat MRI sooner.   For vitamin D deficiency, she is status post vitamin D2 50,000 units for 4 weeks, sufficient  at 28.7.    Her other endocrine functions are  within normal limits.   -Please fax a copy of this note to Dr. Naomie Dean , neurology [facility] and Dr. Estrella Deeds, Optometry.  - I advised patient to maintain close follow up with Gareth Morgan, MD for primary care needs.   I spent  20  minutes in the care of the patient today including review of labs from Thyroid Function, CMP, and other relevant labs ; imaging/biopsy records (current and previous including abstractions from other facilities); face-to-face time discussing  her lab  results and symptoms, medications doses, her options of short and long term treatment based on the latest standards of care / guidelines;   and documenting the encounter.  Earlie Gettys Headings  participated in the discussions, expressed understanding, and voiced agreement with the above plans.  All questions were answered to her satisfaction. she is encouraged to contact clinic should she have any questions or concerns prior to her return visit.   Follow up plan: Return in about 6 months (around 08/31/2023) for F/U with Pre-visit Labs.  Marquis Lunch, MD  Phone: [phone]  Fax: [phone]  -  This note was partially dictated with voice recognition software. Similar sounding words can be transcribed inadequately or may not  be corrected upon review.  02/28/2023, 9:14 AM

## 2023-05-31 ENCOUNTER — Other Ambulatory Visit (HOSPITAL_COMMUNITY): Payer: Self-pay

## 2023-09-01 ENCOUNTER — Telehealth: Payer: Self-pay | Admitting: "Endocrinology

## 2023-09-01 DIAGNOSIS — E221 Hyperprolactinemia: Secondary | ICD-10-CM

## 2023-09-01 NOTE — Telephone Encounter (Signed)
 Spoke with pt's mother advising her that the treatment for hyperprolactinemia nor the high prolactin are effective ways of avoiding her menstrual cycle or birth control but pt can have her lab work to check her prolactin level sooner to see where her level is. Advised pt should consult an Ob/Gyn provider for options per Dr.Nida. Understanding voiced.

## 2023-09-01 NOTE — Telephone Encounter (Signed)
 Dr Lenis Patient's mom sent me a message - please read below  Hey!  Ronnald has an appt 1/20, she'll get her labs next week, double checking the order was placed.  Also, she is getting married on 11/12/23. Her period has been irregular since she ran out of her meds...Dr. Lenis told her when she ran out to not get a refill until he sees her again.  She has no desire to be on birth control now or after the wedding.  Well, when she picked her wedding date, she wasn't supposed to be on her period, now she's supposed to start on her wedding day.  Can you ask him if there is anything we can get in her system to stop or delay her cycle so she can avoid starting on her wedding day or having it on her honeymoon.  Ty!!!

## 2023-09-02 DIAGNOSIS — E221 Hyperprolactinemia: Secondary | ICD-10-CM | POA: Diagnosis not present

## 2023-09-03 LAB — PROLACTIN: Prolactin: 41.7 ng/mL — ABNORMAL HIGH (ref 4.8–33.4)

## 2023-09-07 ENCOUNTER — Other Ambulatory Visit (HOSPITAL_BASED_OUTPATIENT_CLINIC_OR_DEPARTMENT_OTHER): Payer: Self-pay

## 2023-09-07 ENCOUNTER — Other Ambulatory Visit (HOSPITAL_COMMUNITY): Payer: Self-pay

## 2023-09-07 ENCOUNTER — Other Ambulatory Visit: Payer: Self-pay | Admitting: "Endocrinology

## 2023-09-07 ENCOUNTER — Other Ambulatory Visit: Payer: Self-pay

## 2023-09-07 DIAGNOSIS — E221 Hyperprolactinemia: Secondary | ICD-10-CM

## 2023-09-07 MED ORDER — CABERGOLINE 0.5 MG PO TABS
0.2500 mg | ORAL_TABLET | ORAL | 1 refills | Status: DC
Start: 2023-09-07 — End: 2023-09-12
  Filled 2023-09-07 (×3): qty 8, 84d supply, fill #0
  Filled 2023-09-07: qty 8, 90d supply, fill #0

## 2023-09-07 NOTE — Telephone Encounter (Signed)
 Pt's mother called regarding prolactin results. Pt has not taken Cabergoline  since December. Placed lab results on your desk. Please advise.

## 2023-09-07 NOTE — Telephone Encounter (Signed)
 Spoke with pt's mother making her aware pt needs to restart her cabergoline  and Rx had been sent to [facility] and to be sure pt keeps her upcoming appointment with us  per Dr.Nida. Understanding voiced.

## 2023-09-08 ENCOUNTER — Other Ambulatory Visit: Payer: Self-pay

## 2023-09-12 ENCOUNTER — Ambulatory Visit (INDEPENDENT_AMBULATORY_CARE_PROVIDER_SITE_OTHER): Payer: 59 | Admitting: "Endocrinology

## 2023-09-12 ENCOUNTER — Encounter: Payer: Self-pay | Admitting: "Endocrinology

## 2023-09-12 VITALS — BP 96/60 | HR 80 | Ht 68.0 in | Wt 159.8 lb

## 2023-09-12 DIAGNOSIS — E221 Hyperprolactinemia: Secondary | ICD-10-CM | POA: Diagnosis not present

## 2023-09-12 MED ORDER — CABERGOLINE 0.5 MG PO TABS: 0.2500 mg | ORAL_TABLET | ORAL | 1 refills | Status: DC

## 2023-09-12 NOTE — Progress Notes (Signed)
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[employer]  Tobacco Use   Smoking status: Never   Smokeless tobacco: Never  Vaping Use   Vaping status: Never Used  Substance and Sexual Activity   Alcohol use: No   Drug use: No   Sexual activity: Never    Birth control/protection: None  Other Topics Concern   Not on file  Social History Narrative   Lives at home w/ her family   Right-handed   Caffeine: drinks 1 diet drink per day   Social Drivers of Health   Financial Resource Strain: Not on file  Food Insecurity: Not on file  Transportation Needs: Not on file  Physical Activity: Not on file  Stress: Not on file  Social Connections: Not on file   Outpatient Encounter Medications as of 09/12/2023  Medication Sig   cabergoline (DOSTINEX) 0.5 MG tablet Take 0.5 tablets (0.25 mg total) by mouth 2 (two) times a week.   ibuprofen (ADVIL,MOTRIN) 200 MG tablet Take 400 mg by mouth every 6 (six) hours as needed.   [DISCONTINUED] cabergoline (DOSTINEX) 0.5 MG tablet Take 0.5 tablets (0.25 mg total) by mouth once a week.   No facility-administered encounter medications on file as of 09/12/2023.   ALLERGIES: Allergies  Allergen Reactions   Zithromax [Azithromycin] Hives    VACCINATION STATUS:  There is no immunization history on file for this patient.  HPI   23 year old female patient with medical history as above. She is being seen in follow-up for  hyperprolactinemia associated with pituitary hyperplasia  confirmed by MRI on 3 separate occasions, since 12/02/2016.  -She was treated with cabergoline until her last visit when her treatment was tapered off.  In the subsequent month her prolactin slowly increased to 41.7 without galactorrhea or menstrual irregularities.   -  She has normal/regular menstrual cycles.  She is getting married in 3 months with no firm plan for pregnancy anytime soon.   She denies any visual field deficits nor headaches.  She last underwent MRI of the brain in November 2018 due to headaches, dizziness. This study was reported to show similar findings with her prior studies including 2 MRIs done in 02-Dec-2016.   The second MRI in 04/12/2018confirmed pituitary height of 11.3 with no discrete adenoma however possible mass effect upon overlying optic chiasm, causing mild flattening of the optic nerves bilaterally.  No micro or macroadenoma was reported.  - She reports clinical improvement with no further visual complaints nor headaches. - She denies galactorrhea.    Review of Systems Limited: As above.   Objective:    BP 96/60   Pulse 80   Ht 5\' 8"  (1.727 m)   Wt 159 lb 12.8 oz (72.5 kg)   BMI 24.30 kg/m  Wt Readings from Last 3 Encounters:  09/12/23 159 lb 12.8 oz (72.5 kg)  02/28/23 151 lb (68.5 kg)  01/29/22 143 lb (64.9 kg)    Physical Exam   Recent Results (from the past 2160 hours)  Prolactin     Status: Abnormal   Collection Time: 09/02/23  8:58 AM  Result Value Ref Range   Prolactin 41.7 (H) 4.8 - 33.4 ng/mL    Assessment & Plan:   1. Hyperprolactinemia (HCC)   -The official report of the MRI is that of pituitary hyperplasia  ( with no distinct adenoma) to significant height of 11.3 mm ( normal at 9 mm) with local mass effect on the optic chiasm causing clinically significant blurry vision/headaches.  -  She was given a trial of treatment withdrawal during her last visit in July 2024.   Her prolactin progressively increased to 41.7 without galactorrhea nor menstrual abnormalities  She would like to get treated for hyperprolactinemia.  I discussed and reinitiated cabergoline 0.25 mg p.o. twice a week with plan to repeat labs in 8 weeks.  Patient is getting married in 3 months with no firm plan for pregnancy.  I discussed with the patient and her mother the possibility of pregnancy when prolactin is lower to target and her to use reliable contraception she wants to delay pregnancy.  -If she remains clinically stable, she would not require pituitary/sella MRI for now.  - However, if she starts to have sudden visual change or worsening headache, she may need repeat MRI sooner.    Her other endocrine functions are  within normal limits.   -Please fax a copy of this note to Dr. Naomie Dean , neurology [facility] and Dr. Estrella Deeds, Optometry.  - I advised patient to tablet show OB/GYN care/primary care .   I spent  21  minutes in the care of the patient today including review of labs from Thyroid Function, CMP, and other relevant labs ; imaging/biopsy records (current and previous including abstractions from other facilities); face-to-face time discussing  her lab results and symptoms, medications doses, her options of short and long term treatment based on the latest standards of care / guidelines;   and documenting the encounter.  Katherine Howe  participated in the discussions, expressed understanding, and voiced agreement with the above plans.  All questions were answered to her satisfaction. she is encouraged to contact clinic should she have any questions or concerns prior to her return visit.   Follow up plan: Return in about 8 weeks (around 11/07/2023) for F/U with Pre-visit Labs.  Marquis Lunch, MD Phone: [phone]  Fax: [phone]  -  This note was partially dictated with voice recognition software. Similar sounding words can be transcribed inadequately or  may not  be corrected upon review.  09/12/2023, 10:34 AM

## 2023-11-04 DIAGNOSIS — E221 Hyperprolactinemia: Secondary | ICD-10-CM | POA: Diagnosis not present

## 2023-11-05 LAB — PROLACTIN: Prolactin: 1.7 ng/mL — ABNORMAL LOW (ref 4.8–33.4)

## 2023-11-08 ENCOUNTER — Ambulatory Visit (INDEPENDENT_AMBULATORY_CARE_PROVIDER_SITE_OTHER): Payer: 59 | Admitting: "Endocrinology

## 2023-11-08 ENCOUNTER — Encounter: Payer: Self-pay | Admitting: "Endocrinology

## 2023-11-08 VITALS — BP 106/70 | HR 84 | Ht 68.0 in | Wt 158.2 lb

## 2023-11-08 DIAGNOSIS — E221 Hyperprolactinemia: Secondary | ICD-10-CM

## 2023-11-08 NOTE — Progress Notes (Signed)
 11/08/2023                   Endocrinology follow-up note  Subjective:    Patient ID: Katherine Howe, female    DOB: [birth year], PCP Pcp, No   Past Medical History:  Diagnosis Date   Dizziness    Headache    Past Surgical History:  Procedure Laterality Date   TOOTH EXTRACTION     5th-6th grade   WISDOM TOOTH EXTRACTION  19-Nov-2015   Social History   Socioeconomic History   Marital status: Single    Spouse name: Not on file   Number of children: 0   Years of education: 12   Highest education level: Not on file  Occupational History   Occupation: Dr. [employer]  Tobacco Use   Smoking status: Never   Smokeless tobacco: Never  Vaping Use   Vaping status: Never Used  Substance and Sexual Activity   Alcohol use: No   Drug use: No   Sexual activity: Never    Birth control/protection: None  Other Topics Concern   Not on file  Social History Narrative   Lives at home w/ her family   Right-handed   Caffeine: drinks 1 diet drink per day   Social Drivers of Health   Financial Resource Strain: Not on file  Food Insecurity: Not on file  Transportation Needs: Not on file  Physical Activity: Not on file  Stress: Not on file  Social Connections: Not on file   Outpatient Encounter Medications as of 11/08/2023  Medication Sig   cabergoline (DOSTINEX) 0.5 MG tablet Take 0.5 tablets (0.25 mg total) by mouth 2 (two) times a week.   ibuprofen (ADVIL,MOTRIN) 200 MG tablet Take 400 mg by mouth every 6 (six) hours as needed.   No facility-administered encounter medications on file as of 11/08/2023.   ALLERGIES: Allergies  Allergen Reactions   Zithromax [Azithromycin] Hives    VACCINATION STATUS:  There is no immunization history on file for this patient.  HPI   23 year old female patient with medical history as above. She is being seen in follow-up for hyperprolactinemia associated with pituitary hyperplasia  confirmed by MRI on 3 separate occasions, since 18-Nov-2016.   -She was restarted on cabergoline 0.25 mg p.o. weekly which had controlled her prolactin to 1.7 from 41.7.  She has no new complaints today.  She is getting married this coming weekend.  She does not have any immediate plan for childbearing.   -  She has normal/regular menstrual cycles.  She denies any visual field deficits nor headaches.  She last underwent MRI of the brain in November 2018 due to headaches, dizziness. This study was reported to show similar findings with her prior studies including 2 MRIs done in November 18, 2016.   The second MRI in Mar 29, 2018confirmed pituitary height of 11.3 with no discrete adenoma however possible mass effect upon overlying optic chiasm, causing mild flattening of the optic nerves bilaterally.  No micro or macroadenoma was reported.  - She reports clinical improvement with no further visual complaints nor headaches. - She denies galactorrhea.    Review of Systems Limited: As above.   Objective:    BP 106/70   Pulse 84   Ht 5\' 8"  (1.727 m)   Wt 158 lb 3.2 oz (71.8 kg)   BMI 24.05 kg/m   Wt Readings from Last 3 Encounters:  11/08/23 158 lb 3.2 oz (71.8 kg)  09/12/23 159 lb 12.8 oz (72.5 kg)  02/28/23 151 lb (68.5 kg)    Physical Exam   Recent Results (from the past 2160 hours)  Prolactin     Status: Abnormal   Collection Time: 09/02/23  8:58 AM  Result Value Ref Range   Prolactin 41.7 (H) 4.8 - 33.4 ng/mL  Prolactin     Status: Abnormal   Collection Time: 11/04/23 12:27 PM  Result Value Ref Range   Prolactin 1.7 (L) 4.8 - 33.4 ng/mL    Assessment & Plan:   1. Hyperprolactinemia (HCC)   -The official report of the MRI is that of pituitary hyperplasia  ( with no distinct adenoma) to significant height of 11.3 mm ( normal at 9 mm) with local mass effect on the optic chiasm causing clinically significant blurry vision/headaches.  -  She was given a trial of treatment withdrawal during her last visit in July 2024.  Her prolactin  progressively increased to 41.7 without galactorrhea nor menstrual abnormalities.  This necessitated resumption of cabergoline at 0.25 mg p.o. twice a week.  She will be continued on the same dose and regimen.   Patient is getting married this coming week with no firm plan for immediate childbearing . I discussed with the patient  the possibility of pregnancy when prolactin is lowered to target and her to use reliable contraception if she wants to delay pregnancy.  -If she remains clinically stable, she would not require pituitary/sella MRI for now.  - However, if she starts to have sudden visual change or worsening headache, she may need repeat MRI sooner.    Her other endocrine functions are  within normal limits.   -Please fax a copy of this note to Dr. Naomie Dean , neurology [facility] and Dr. Estrella Deeds, Optometry.  - I advised patient to tablet show OB/GYN care/primary care .    I spent  21  minutes in the care of the patient today including review of labs from Thyroid Function, CMP, and other relevant labs ; imaging/biopsy records (current and previous including abstractions from other facilities); face-to-face time discussing  her lab results and symptoms, medications doses, her options of short and long term treatment based on the latest standards of care / guidelines;   and documenting the encounter.  Katherine Howe  participated in the discussions, expressed understanding, and voiced agreement with the above plans.  All questions were answered to her satisfaction. she is encouraged to contact clinic should she have any questions or concerns prior to her return visit.   Follow up plan: Return in about 6 months (around 05/10/2024) for F/U with Pre-visit Labs.  Marquis Lunch, MD Phone: [phone]  Fax: [phone]  -  This note was partially dictated with voice recognition software. Similar sounding words can be transcribed inadequately or may not  be corrected upon  review.  11/08/2023, 2:12 PM

## 2023-11-09 DIAGNOSIS — H5203 Hypermetropia, bilateral: Secondary | ICD-10-CM | POA: Diagnosis not present

## 2023-12-08 ENCOUNTER — Other Ambulatory Visit: Payer: Self-pay | Admitting: *Deleted

## 2023-12-08 ENCOUNTER — Other Ambulatory Visit (HOSPITAL_COMMUNITY): Payer: Self-pay

## 2023-12-08 DIAGNOSIS — E221 Hyperprolactinemia: Secondary | ICD-10-CM

## 2023-12-08 MED ORDER — CABERGOLINE 0.5 MG PO TABS
0.2500 mg | ORAL_TABLET | ORAL | 1 refills | Status: DC
Start: 2023-12-08 — End: 2023-12-12

## 2023-12-12 ENCOUNTER — Other Ambulatory Visit: Payer: Self-pay

## 2023-12-12 ENCOUNTER — Other Ambulatory Visit (HOSPITAL_COMMUNITY): Payer: Self-pay

## 2023-12-12 DIAGNOSIS — E221 Hyperprolactinemia: Secondary | ICD-10-CM

## 2023-12-12 MED ORDER — CABERGOLINE 0.5 MG PO TABS
0.2500 mg | ORAL_TABLET | ORAL | 0 refills | Status: DC
  Filled 2023-12-15: qty 8, 56d supply, fill #0

## 2023-12-15 ENCOUNTER — Other Ambulatory Visit (HOSPITAL_COMMUNITY): Payer: Self-pay

## 2023-12-16 ENCOUNTER — Telehealth: Payer: Self-pay

## 2023-12-16 ENCOUNTER — Other Ambulatory Visit (HOSPITAL_BASED_OUTPATIENT_CLINIC_OR_DEPARTMENT_OTHER): Payer: Self-pay

## 2023-12-16 ENCOUNTER — Other Ambulatory Visit (HOSPITAL_COMMUNITY): Payer: Self-pay

## 2023-12-16 NOTE — Telephone Encounter (Signed)
 Left a message with [facility] requesting a return call to the office.

## 2023-12-19 ENCOUNTER — Telehealth: Payer: Self-pay

## 2023-12-19 NOTE — Telephone Encounter (Signed)
 Left a message for Megan with [facility] requesting a return call to the office.

## 2023-12-20 ENCOUNTER — Other Ambulatory Visit: Payer: Self-pay

## 2023-12-20 ENCOUNTER — Other Ambulatory Visit (HOSPITAL_COMMUNITY): Payer: Self-pay

## 2023-12-20 DIAGNOSIS — E221 Hyperprolactinemia: Secondary | ICD-10-CM

## 2023-12-20 MED ORDER — CABERGOLINE 0.5 MG PO TABS
0.2500 mg | ORAL_TABLET | ORAL | 0 refills | Status: DC
  Filled 2023-12-20: qty 12, 84d supply, fill #0

## 2024-06-03 LAB — PROLACTIN: Prolactin: 3.9 ng/mL — ABNORMAL LOW (ref 4.8–33.4)

## 2024-06-04 ENCOUNTER — Encounter: Payer: Self-pay | Admitting: "Endocrinology

## 2024-06-04 ENCOUNTER — Encounter (HOSPITAL_COMMUNITY): Payer: Self-pay

## 2024-06-04 ENCOUNTER — Other Ambulatory Visit (HOSPITAL_COMMUNITY): Payer: Self-pay

## 2024-06-04 ENCOUNTER — Ambulatory Visit (INDEPENDENT_AMBULATORY_CARE_PROVIDER_SITE_OTHER): Admitting: "Endocrinology

## 2024-06-04 VITALS — BP 98/60 | HR 68 | Ht 68.0 in | Wt 169.4 lb

## 2024-06-04 DIAGNOSIS — E221 Hyperprolactinemia: Secondary | ICD-10-CM

## 2024-06-04 MED ORDER — CABERGOLINE 0.5 MG PO TABS
0.2500 mg | ORAL_TABLET | ORAL | 1 refills | Status: AC
  Filled 2024-06-04 (×2): qty 8, 56d supply, fill #0

## 2024-06-04 NOTE — Progress Notes (Signed)
 06/04/2024                   Endocrinology follow-up note  Subjective:    Patient ID: Katherine Howe, female    DOB: [birth year], PCP Pcp, No   Past Medical History:  Diagnosis Date   Dizziness    Headache    Past Surgical History:  Procedure Laterality Date   TOOTH EXTRACTION     5th-6th grade   WISDOM TOOTH EXTRACTION  2017   Social History   Socioeconomic History   Marital status: Single    Spouse name: Not on file   Number of children: 0   Years of education: 12   Highest education level: Not on file  Occupational History   Occupation: Dr. [employer]  Tobacco Use   Smoking status: Never   Smokeless tobacco: Never  Vaping Use   Vaping status: Never Used  Substance and Sexual Activity   Alcohol use: No   Drug use: No   Sexual activity: Never    Birth control/protection: None  Other Topics Concern   Not on file  Social History Narrative   Lives at home w/ her family   Right-handed   Caffeine: drinks 1 diet drink per day   Social Drivers of Health   Financial Resource Strain: Not on file  Food Insecurity: Not on file  Transportation Needs: Not on file  Physical Activity: Not on file  Stress: Not on file  Social Connections: Not on file   Outpatient Encounter Medications as of 06/04/2024  Medication Sig   cabergoline  (DOSTINEX ) 0.5 MG tablet Take 1/2 tablet (0.25 mg total) by mouth 2 (two) times a week.   ibuprofen  (ADVIL ,MOTRIN ) 200 MG tablet Take 400 mg by mouth every 6 (six) hours as needed.   [DISCONTINUED] cabergoline  (DOSTINEX ) 0.5 MG tablet Take 1/2 tablet (0.25 mg total) by mouth 2 (two) times a week.   No facility-administered encounter medications on file as of 06/04/2024.   ALLERGIES: Allergies  Allergen Reactions   Zithromax [Azithromycin] Hives    VACCINATION STATUS:  There is no immunization history on file for this patient.  HPI   23 year old female patient with medical history as above. She is being seen in follow-up for  hyperprolactinemia associated with pituitary hyperplasia  confirmed by MRI on 3 separate occasions, since 12/10/2016.  - Her previsit labs show controlled prolactin at 3.9.  She will continue to benefit from low-dose cabergoline  0.25 mg twice weekly.    She is married, no plan for immediate childbearing. -  She has normal/regular menstrual cycles.  She denies any visual field deficits nor headaches.  She last underwent MRI of the brain in November 2018 due to headaches, dizziness. This study was reported to show similar findings with her prior studies including 2 MRIs done in 2016/12/10.   The second MRI in 2018/04/20confirmed pituitary height of 11.3 with no discrete adenoma however possible mass effect upon overlying optic chiasm, causing mild flattening of the optic nerves bilaterally.  No micro or macroadenoma was reported.  - She reports clinical improvement with no further visual complaints nor headaches. - She denies galactorrhea.    Review of Systems Limited: As above.   Objective:    BP 98/60   Pulse 68   Ht 5' 8 (1.727 m)   Wt 169 lb 6.4 oz (76.8 kg)   BMI 25.76 kg/m   Wt Readings from Last 3 Encounters:  06/04/24 169 lb 6.4 oz (76.8 kg)  11/08/23 158 lb 3.2 oz (71.8 kg)  09/12/23 159 lb 12.8 oz (72.5 kg)    Physical Exam   Recent Results (from the past 2160 hours)  Prolactin     Status: Abnormal   Collection Time: 06/02/24  9:35 AM  Result Value Ref Range   Prolactin 3.9 (L) 4.8 - 33.4 ng/mL    Assessment & Plan:   1. Hyperprolactinemia (HCC)   -The official report of the MRI is that of pituitary hyperplasia  ( with no distinct adenoma) to significant height of 11.3 mm ( normal at 9 mm) with local mass effect on the optic chiasm causing clinically significant blurry vision/headaches. She is currently on low-dose cabergoline  to maintain prolactin within normal range.  She presents with labs showing prolactin 3.9.  She is advised to continue cabergoline  at 0.25  mg p.o. twice a week.  She is not married, with no immediate plans for childbearing. I discussed with the patient  the possibility of pregnancy when prolactin is lowered to target and her to use reliable contraception if she wants to delay pregnancy.  -If she remains clinically stable, she would not require pituitary/sella MRI for now.  - However, if she starts to have sudden visual change or worsening headache, she may need repeat MRI sooner.   -Her next labs will include thyroid  function test, IGF-I, and CMP along with prolactin.  -Please fax a copy of this note to Dr. Onetha Epp , neurology [facility] and Dr. Norleen Shy, Optometry.  - I advised patient to tablet show OB/GYN care/primary care .   I spent  22  minutes in the care of the patient today including review of labs from Thyroid  Function, CMP, and other relevant labs ; imaging/biopsy records (current and previous including abstractions from other facilities); face-to-face time discussing  her lab results and symptoms, medications doses, her options of short and long term treatment based on the latest standards of care / guidelines;   and documenting the encounter.  Katherine Howe  participated in the discussions, expressed understanding, and voiced agreement with the above plans.  All questions were answered to her satisfaction. she is encouraged to contact clinic should she have any questions or concerns prior to her return visit.   Follow up plan: Return in about 6 months (around 12/03/2024) for F/U with Pre-visit Labs.  Ranny Earl, MD Phone: [phone]  Fax: [phone]  -  This note was partially dictated with voice recognition software. Similar sounding words can be transcribed inadequately or may not  be corrected upon review.  06/04/2024, 12:21 PM

## 2024-06-15 DIAGNOSIS — H6092 Unspecified otitis externa, left ear: Secondary | ICD-10-CM | POA: Diagnosis not present

## 2024-06-15 DIAGNOSIS — H6122 Impacted cerumen, left ear: Secondary | ICD-10-CM | POA: Diagnosis not present

## 2024-09-09 NOTE — Progress Notes (Unsigned)
 "  Katherine Howe - 24 y.o. female MRN [id number]  Date of birth: 01-16-2001  Office Visit Note: Visit Date: 09/10/2024 PCP: Pcp, No Referred by: No ref. provider found  Subjective: No chief complaint on file.  HPI: Katherine Howe is a pleasant 24 y.o. female who presents today for evaluation of ongoing right wrist radial sided pain has been present now for multiple months.  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  Has not really undergone any significant workup or treatment to date.  Pertinent ROS were reviewed with the patient and found to be negative unless otherwise specified above in HPI.   Visit Reason: R wrist radial sided pain Duration of symptoms: August  Hand dominance: right Occupation: Asst Teacher  Diabetic: No Smoking: No Heart/Lung History: None Blood Thinners: None  Prior Testing/EMG:None Injections (Date):None Treatments:None Prior Surgery:None    Assessment & Plan: Visit Diagnoses:  1. De Quervain's tenosynovitis, right   2. Pain in right hand     Plan: Extensive discussion was had with the patient today regarding her right wrist radial sided pain.  Patient has signs and symptoms consistent with de Quervain's tenosynovitis.  We discussed the underlying etiology and pathophysiology of this condition as well as treatment modalities ranging from conservative to surgical.  From a conservative standpoint, we discussed activity modification, anti-inflammatory medication both topical and oral, bracing, therapy and cortisone injections.  From a surgical standpoint, I explained to patient that we can perform right wrist first extensor compartment release should symptoms remain affected conservative care.   Given that she has not undergone any treatment yet, we will begin with right wrist  Comfort Cool bracing and injection of cortisone to the right wrist first extensor compartment for symptom relief.  Also explained that the injection will be helpful from a diagnostic standpoint as well.  Risk-benefit of the injection were discussed in detail, patient elected to proceed and tolerated injection without issue.  She is welcome to follow-up with me as needed moving forward.  Mother was present today and expressed full understanding as well.  Follow-up: No follow-ups on file.   Meds & Orders: No orders of the defined types were placed in this encounter.   Orders Placed This Encounter  Procedures   Hand/UE Inj   XR Hand Complete Right     Procedures: Hand/UE Inj: R extensor compartment 1 for de Quervain's tenosynovitis on 09/10/2024 10:49 AM Indications: pain Details: 25 G needle Medications: 1 mL lidocaine  1 %; 6 mg betamethasone  acetate-betamethasone  sodium phosphate 6 (3-3) MG/ML Outcome: tolerated well, no immediate complications Procedure, treatment alternatives, risks and benefits explained, specific risks discussed. Consent was given by the patient.          Clinical History: No specialty comments available.  She reports that she has never smoked. She has never used smokeless tobacco. No results for input(s): HGBA1C, LABURIC in the last 8760 hours.  Objective:   Vital Signs: There were no vitals taken for this visit.  Physical Exam  Gen: Well-appearing, in no acute distress; non-toxic CV: Regular Rate. Well-perfused. Warm.  Resp: Breathing unlabored on room air; no wheezing. Psych: Fluid speech in conversation; appropriate affect; normal thought process  Ortho Exam General: Patient is well appearing and in no distress. Cervical spine mobility is full in all directions:  Skin and Muscle: No skin changes  are apparent to upper extremities.  Muscle bulk and contour normal, no signs of atrophy.     Range of Motion and Palpation Tests: Mobility is full about  the elbows with flexion and extension.  Forearm supination and pronation are 85/85 bilaterally.  Wrist flexion/extension is 75/65 left side, wrist flexion/extension limited secondary to pain right side, approximately 65/55.  Digital flexion and extension are full.    No cords or nodules are palpated.  No triggering is observed.   Finklestein test is positive right side, significant pain with associated moderate swelling and tenderness over the radial styloid.    Neurologic, Vascular, Motor: Sensation is intact to light touch in the median/radial/ulnar distributions.   Fingers pink and well perfused.  Capillary refill is brisk.      Imaging: No results found.  Past Medical/Family/Surgical/Social History: Medications & Allergies reviewed per EMR, new medications updated. Patient Active Problem List   Diagnosis Date Noted   Hyperprolactinemia 12/01/2016   Right ankle pain 10/01/2013   Past Medical History:  Diagnosis Date   Dizziness    Headache    Family History  Problem Relation Age of Onset   Cancer Other    Diabetes Other    Heart attack Maternal Grandmother    Diabetes Paternal Grandfather    Past Surgical History:  Procedure Laterality Date   TOOTH EXTRACTION     5th-6th grade   WISDOM TOOTH EXTRACTION  2017   Social History   Occupational History   Occupation: Dr. [employer]  Tobacco Use   Smoking status: Never   Smokeless tobacco: Never  Vaping Use   Vaping status: Never Used  Substance and Sexual Activity   Alcohol use: No   Drug use: No   Sexual activity: Never    Birth control/protection: None    Brant Peets Afton Alderton, M.D. [facility], Hand Surgery  "

## 2024-09-10 ENCOUNTER — Other Ambulatory Visit: Payer: Self-pay

## 2024-09-10 ENCOUNTER — Ambulatory Visit: Payer: Self-pay | Admitting: Orthopedic Surgery

## 2024-09-10 DIAGNOSIS — M654 Radial styloid tenosynovitis [de Quervain]: Secondary | ICD-10-CM

## 2024-09-10 DIAGNOSIS — M79641 Pain in right hand: Secondary | ICD-10-CM

## 2024-09-10 MED ORDER — BETAMETHASONE SOD PHOS & ACET 6 (3-3) MG/ML IJ SUSP
6.0000 mg | INTRAMUSCULAR | Status: AC | PRN
  Administered 2024-09-10: 6 mg via INTRA_ARTICULAR

## 2024-09-10 MED ORDER — LIDOCAINE HCL 1 % IJ SOLN
1.0000 mL | INTRAMUSCULAR | Status: AC | PRN
  Administered 2024-09-10: 1 mL

## 2024-11-26 ENCOUNTER — Ambulatory Visit: Admitting: "Endocrinology
